# Patient Record
Sex: Male | Born: 1990
Health system: Southern US, Community
[De-identification: ages and names within clinical notes are randomized; demographics above are authoritative.]

---
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
## 2014-05-31 ENCOUNTER — Emergency Department (HOSPITAL_COMMUNITY)
Admission: EM | Admit: 2014-05-31 | Discharge: 2014-05-31 | Disposition: A | Discharge status: Home / Self Care | Payer: BC Managed Care – PPO | Attending: Emergency Medicine | Admitting: Emergency Medicine

## 2014-05-31 ENCOUNTER — Encounter (HOSPITAL_COMMUNITY): Payer: Self-pay

## 2014-05-31 ENCOUNTER — Emergency Department (EMERGENCY_DEPARTMENT_HOSPITAL)
Admission: EM | Admit: 2014-05-31 | Discharge: 2014-06-01 | Disposition: A | Discharge status: Other Acute Inpatient Hospital | Payer: BC Managed Care – PPO | Source: Home / Self Care | Attending: Emergency Medicine | Admitting: Emergency Medicine

## 2014-05-31 ENCOUNTER — Emergency Department (HOSPITAL_COMMUNITY): Payer: BC Managed Care – PPO

## 2014-05-31 ENCOUNTER — Encounter (HOSPITAL_COMMUNITY): Payer: Self-pay | Admitting: *Deleted

## 2014-05-31 DIAGNOSIS — R0602 Shortness of breath: Secondary | ICD-10-CM | POA: Insufficient documentation

## 2014-05-31 DIAGNOSIS — R0981 Nasal congestion: Secondary | ICD-10-CM | POA: Insufficient documentation

## 2014-05-31 DIAGNOSIS — H538 Other visual disturbances: Secondary | ICD-10-CM | POA: Insufficient documentation

## 2014-05-31 DIAGNOSIS — R300 Dysuria: Secondary | ICD-10-CM | POA: Insufficient documentation

## 2014-05-31 DIAGNOSIS — F1110 Opioid abuse, uncomplicated: Secondary | ICD-10-CM | POA: Insufficient documentation

## 2014-05-31 DIAGNOSIS — R509 Fever, unspecified: Secondary | ICD-10-CM | POA: Insufficient documentation

## 2014-05-31 DIAGNOSIS — R197 Diarrhea, unspecified: Secondary | ICD-10-CM

## 2014-05-31 DIAGNOSIS — R6883 Chills (without fever): Secondary | ICD-10-CM | POA: Insufficient documentation

## 2014-05-31 DIAGNOSIS — F1410 Cocaine abuse, uncomplicated: Secondary | ICD-10-CM | POA: Insufficient documentation

## 2014-05-31 DIAGNOSIS — M791 Myalgia: Secondary | ICD-10-CM | POA: Insufficient documentation

## 2014-05-31 DIAGNOSIS — R05 Cough: Secondary | ICD-10-CM | POA: Insufficient documentation

## 2014-05-31 DIAGNOSIS — H539 Unspecified visual disturbance: Secondary | ICD-10-CM

## 2014-05-31 DIAGNOSIS — F1910 Other psychoactive substance abuse, uncomplicated: Secondary | ICD-10-CM

## 2014-05-31 DIAGNOSIS — R110 Nausea: Secondary | ICD-10-CM

## 2014-05-31 DIAGNOSIS — R079 Chest pain, unspecified: Secondary | ICD-10-CM | POA: Insufficient documentation

## 2014-05-31 DIAGNOSIS — F1510 Other stimulant abuse, uncomplicated: Secondary | ICD-10-CM | POA: Insufficient documentation

## 2014-05-31 DIAGNOSIS — R51 Headache: Secondary | ICD-10-CM | POA: Insufficient documentation

## 2014-05-31 DIAGNOSIS — M549 Dorsalgia, unspecified: Secondary | ICD-10-CM | POA: Insufficient documentation

## 2014-05-31 DIAGNOSIS — F1310 Sedative, hypnotic or anxiolytic abuse, uncomplicated: Secondary | ICD-10-CM | POA: Insufficient documentation

## 2014-05-31 DIAGNOSIS — F1210 Cannabis abuse, uncomplicated: Secondary | ICD-10-CM | POA: Insufficient documentation

## 2014-05-31 LAB — CBC WITH DIFFERENTIAL/PLATELET
BASOS ABS: 0 10*3/uL (ref 0.0–0.1)
BASOS PCT: 0 % (ref 0–1)
Eosinophils Absolute: 0.1 10*3/uL (ref 0.0–0.7)
Eosinophils Relative: 1 % (ref 0–5)
HEMATOCRIT: 38.4 % — AB (ref 39.0–52.0)
Hemoglobin: 13.1 g/dL (ref 13.0–17.0)
LYMPHS ABS: 1.8 10*3/uL (ref 0.7–4.0)
LYMPHS PCT: 29 % (ref 12–46)
MCH: 29.8 pg (ref 26.0–34.0)
MCHC: 34.1 g/dL (ref 30.0–36.0)
MCV: 87.5 fL (ref 78.0–100.0)
MONO ABS: 0.6 10*3/uL (ref 0.1–1.0)
MONOS PCT: 9 % (ref 3–12)
Neutro Abs: 3.8 10*3/uL (ref 1.7–7.7)
Neutrophils Relative %: 61 % (ref 43–77)
Platelets: 195 10*3/uL (ref 150–400)
RBC: 4.39 MIL/uL (ref 4.22–5.81)
RDW: 13.4 % (ref 11.5–15.5)
WBC: 6.2 10*3/uL (ref 4.0–10.5)

## 2014-05-31 LAB — RAPID URINE DRUG SCREEN, HOSP PERFORMED
Amphetamines: POSITIVE — AB
BARBITURATES: NOT DETECTED
Benzodiazepines: POSITIVE — AB
Cocaine: POSITIVE — AB
Opiates: NOT DETECTED
TETRAHYDROCANNABINOL: POSITIVE — AB

## 2014-05-31 LAB — COMPREHENSIVE METABOLIC PANEL
ALBUMIN: 4.3 g/dL (ref 3.5–5.2)
ALK PHOS: 110 U/L (ref 39–117)
ALT: 195 U/L — ABNORMAL HIGH (ref 0–53)
ANION GAP: 12 (ref 5–15)
AST: 78 U/L — AB (ref 0–37)
BILIRUBIN TOTAL: 0.3 mg/dL (ref 0.3–1.2)
BUN: 10 mg/dL (ref 6–23)
CALCIUM: 9.6 mg/dL (ref 8.4–10.5)
CHLORIDE: 104 meq/L (ref 96–112)
CO2: 26 meq/L (ref 19–32)
CREATININE: 1.03 mg/dL (ref 0.50–1.35)
GFR calc Af Amer: >90 mL/min (ref >90)
GFR calc non Af Amer: >90 mL/min (ref >90)
Glucose, Bld: 93 mg/dL (ref 70–99)
POTASSIUM: 3.6 meq/L — AB (ref 3.7–5.3)
SODIUM: 142 meq/L (ref 137–147)
TOTAL PROTEIN: 7.5 g/dL (ref 6.0–8.3)

## 2014-05-31 LAB — ETHANOL: Alcohol, Ethyl (B): <11 mg/dL (ref 0–11)

## 2014-05-31 MED ORDER — LORAZEPAM 2 MG/ML IJ SOLN
1.0000 mg | Freq: Once | INTRAMUSCULAR | Status: AC
  Administered 2014-05-31: 1 mg via INTRAVENOUS
  Filled 2014-05-31: qty 1

## 2014-05-31 MED ORDER — SODIUM CHLORIDE 0.9 % IV SOLN: INTRAVENOUS | Status: DC

## 2014-05-31 MED ORDER — SODIUM CHLORIDE 0.9 % IV BOLUS (SEPSIS)
1000.0000 mL | Freq: Once | INTRAVENOUS | Status: AC
  Administered 2014-05-31: 1000 mL via INTRAVENOUS

## 2014-05-31 MED ORDER — CLONIDINE HCL 0.2 MG PO TABS: 0.2000 mg | ORAL_TABLET | Freq: Once | ORAL | Status: DC

## 2014-05-31 MED ORDER — ONDANSETRON HCL 4 MG/2ML IJ SOLN
4.0000 mg | Freq: Once | INTRAMUSCULAR | Status: AC
  Administered 2014-05-31: 4 mg via INTRAVENOUS
  Filled 2014-05-31: qty 2

## 2014-05-31 MED ORDER — CLONIDINE HCL 0.2 MG PO TABS
0.2000 mg | ORAL_TABLET | Freq: Once | ORAL | Status: AC
  Administered 2014-05-31: 0.2 mg via ORAL
  Filled 2014-05-31: qty 1

## 2014-05-31 NOTE — Discharge Instructions (Signed)
Chemical Dependency Chemical dependency is an addiction to drugs or alcohol. It is characterized by the repeated behavior of seeking out and using drugs and alcohol despite harmful consequences to the health and safety of ones self and others.  RISK FACTORS There are certain situations or behaviors that increase a person's risk for chemical dependency. These include:  A family history of chemical dependency.  A history of mental health issues, including depression and anxiety.  A home environment where drugs and alcohol are easily available to you.  Drug or alcohol use at a young age. SYMPTOMS  The following symptoms can indicate chemical dependency:  Inability to limit the use of drugs or alcohol.  Nausea, sweating, shakiness, and anxiety that occurs when alcohol or drugs are not being used.  An increase in amount of drugs or alcohol that is necessary to get drunk or high. People who experience these symptoms can assess their use of drugs and alcohol by asking themselves the following questions:  Have you been told by friends or family that they are worried about your use of alcohol or drugs?  Do friends and family ever tell you about things you did while drinking alcohol or using drugs that you do not remember?  Do you lie about using alcohol or drugs or about the amounts you use?  Do you have difficulty completing daily tasks unless you use alcohol or drugs?  Is the level of your work or school performance lower because of your drug or alcohol use?  Do you get sick from using drugs or alcohol but keep using anyway?  Do you feel uncomfortable in social situations unless you use alcohol or drugs?  Do you use drugs or alcohol to help forget problems? An answer of yes to any of these questions may indicate chemical dependency. Professional evaluation is suggested. Document Released: 07/10/2001 Document Revised: 10/08/2011 Document Reviewed: 09/21/2010 [name] Patient  Information 2015 [place], [place]. This information is not intended to replace advice given to you by your health care provider. Make sure you discuss any questions you have with your health care provider.   Emergency Department Resource Guide 1) Find a Doctor and Pay Out of Pocket Although you won't have to find out who is covered by your insurance plan, it is a good idea to ask around and get recommendations. You will then need to call the office and see if the doctor you have chosen will accept you as a new patient and what types of options they offer for patients who are self-pay. Some doctors offer discounts or will set up payment plans for their patients who do not have insurance, but you will need to ask so you aren't surprised when you get to your appointment.  2) Contact Your Local Health Department Not all health departments have doctors that can see patients for sick visits, but many do, so it is worth a call to see if yours does. If you don't know where your local health department is, you can check in your phone book. The CDC also has a tool to help you locate your state's health department, and many state websites also have listings of all of their local health departments.  3) Find a Walk-in Clinic If your illness is not likely to be very severe or complicated, you may want to try a walk in clinic. These are popping up all over the country in pharmacies, drugstores, and shopping centers. They're usually staffed by nurse practitioners or physician assistants that have  been trained to treat common illnesses and complaints. They're usually fairly quick and inexpensive. However, if you have serious medical issues or chronic medical problems, these are probably not your best option.  No Primary Care Doctor: - Call Health Connect at  [phone] - they can help you locate a primary care doctor that  accepts your insurance, provides certain services, etc. - Physician Referral Service-  [phone]  Chronic Pain Problems: Organization         Address  Phone   Notes  [facility]  [phone] Patients need to be referred by their primary care doctor.   Medication Assistance: Organization         Address  Phone   Notes  [facility] Medication [facility] [address] [place] [place], [place] [address] [phone] --Must be a resident of [facility] -- Must have NO insurance coverage whatsoever (no Medicaid/ Medicare, etc.) -- The pt. MUST have a primary care doctor that directs their care regularly and follows them in the community   [facility]ist  [phone]   [employer]  [phone]    Agencies that provide inexpensive medical care: Organization         Address  Phone   Notes  [facility]  [phone]   [facility]    [phone]   [facility] [address] [place], [place] [address] [phone]   [facility] [address] [place], [place] [phone]   Planned Parenthood    [phone]   [facility]    [phone]   Community Health and [facility]  [address], [place] Phone:  [phone], Fax:  [phone] Hours of Operation:  9 am - 6 pm, M-F.  Also accepts Medicaid/Medicare and self-pay.  [facility]  [address], [place] Phone: [phone], Fax: [phone]. Hours of Operation:  8:30 am - 5:30 pm, M-F.  Also accepts Medicaid and self-pay.  [facility] [place] [address] [place] Phone: [phone]   [facility] [address] Natasha Bence [place], [place] [phone], Ext. [phone] Mondays & Thursdays: 7-9 AM.  First 15 patients are seen on a first come, first serve basis.    Medicaid-accepting [facility] Providers:  Organization         Address  Phone   Notes  [facility] [address],  [place] [phone] Also accepts self-pay patients.  [facility] [address] Laurell Josephs [place], [place]  [phone]   [facility] [address], [place] [phone]   [facility] [address] [place] [address] [place] [phone]   Renaye Rakers [address], [place]   [phone] Only accepts [place] patients after they have their name applied to their card.   Self-Pay (no insurance) in [facility]:  Organization         Address  Phone   Notes  Sickle Cell Patients, [facility] Internal Medicine [address] [place], [place] [phone]   [facility] Care [address] [place], [place] [phone]   [facility] [place]  [address], [place] [phone]   [facility]/Dr. Osei-Bonsu  [address] [place] [address], [place] or  [phone], [address], [place] [phone] Phone number for both [facility] and [place] locations is the same.  Urgent Medical and [facility] [address], [place] [phone]) [phone]   [facility] [place] [address] [place] [address], [place] or [address] [phone]) [phone]   [facility] [address], [place] [phone]) [phone], phone; [phone], fax Sees patients 1st and 3rd Saturday of every month.  Must not qualify for public or private insurance (i.e. Medicaid, Medicare, [place] Health Choice, Veterans' Benefits)  Household income should be no more than 200% of the poverty level The clinic cannot treat you if you are pregnant or think you are pregnant  Sexually transmitted diseases are not treated at the clinic.    Dental Care: Organization         Address  Phone  Notes  [facility] [address], [place] [phone] Accepts children up to age 23 who are enrolled in  [place]icaid or [place] Health Choice; pregnant women with a Medicaid card; and children who have applied for Medicaid or [place] Health Choice, but were declined, whose parents can pay a reduced fee at time of service.  [facility] [place]  [address], [place] [phone] Accepts children up to age 23 who are enrolled in [place] or [place] Health Choice; pregnant women with a Medicaid card; and children who have applied for Medicaid or [place] Health Choice, but were declined, whose parents can pay a reduced fee at time of service.  [facility]  [address], [place] [phone]) [phone] Patients are seen by appointment only. Walk-ins are not accepted. [facility] will see patients 23 years of age and older. Monday - Tuesday (8am-5pm) Most Wednesdays (8:30-5pm) $30 per visit, cash only  [facility]  [address], [facility] [phone]) [phone] Patients are seen by appointment only. Walk-ins are not accepted. [facility] will see patients 23 years of age and older. One Wednesday Evening (Monthly: Volunteer Based).  $30 per visit, cash only  [employer] [facility]  [phone] for adults; Children under age 664, call Graduate Pediatric Dentistry at [phone]. Children aged 614-14, please call [phone] to request a pediatric application.  Dental services are provided in all areas of dental care including fillings, crowns and bridges, complete and partial dentures, implants, gum treatment, root canals, and extractions. Preventive care is also provided. Treatment is provided to both adults and children. Patients are selected via a lottery and there is often a waiting list.   [facility] [address], [place]  [phone] [email or web address]   [facility] [address], [place], [place] [phone], Ext. [phone] Second and Fourth Thursday of each month, opens at 6:30  AM; Clinic ends at 9 AM.  Patients are seen on a first-come first-served basis, and a limited number are seen during each clinic.   [facility]  [address], [place], [place] [phone]   Eligibility Requirements You must have lived in [place], [place], or [place] counties for at least the last three months.   You cannot be eligible for state or federal sponsored [place] Cityhealthcare insurance, including CIGNAVeterans Administration, [place]icaid, or [employer]icare.   You generally cannot be eligible for healthcare insurance through your employer.    How to apply: Eligibility screenings  are held every Tuesday and Wednesday afternoon from 1:00 pm until 4:00 pm. You do not need an appointment for the interview!  [facility] [address] [place] [address], [place], [place] [phone]   [facility]  [phone]   [facility]  [phone]   [facility]  [phone]    Behavioral Health Resources in the Community: Intensive Outpatient Programs Organization         Address  Phone  Notes  [facility] [address], [place], [place] [phone]   [facility] [address], [place], [place] [phone]   [facility] [address], [place], [place]  [phone]   [facility] [address],  [place], [place] [phone] or [phone]   Substance Abuse Resources Organization         Address  Phone  Notes  [facility]  [phone]   [facility]  [phone]   [facility]  [phone]   [facility]  [phone]   Residential & Outpatient Substance Abuse Program  [phone]   Psychological Services Organization         Address  Phone  Notes  [facility]  [phone]- [phone]   [facility]  [phone]- [phone]   [facility] [address], [place] [phone] or  [phone]    Mobile Crisis Teams Organization         Address  Phone  Notes  [facility], Mobile Crisis Care Unit  [phone]   [facility]  [address] [place], [place] [phone]   Doristine LocksSharon DeEsch [address] [place] [phone]    Self-Help/Support Groups Organization         Address  Phone             Notes  [facility] [place] - variety of support groups  [phone]- [phone] Call for more information  Narcotics Anonymous (NA), [facility] [address], [place]  2 meetings at this location   Statisticianesidential Treatment Programs Organization         Address  Phone  Notes  [facility] [address],    [place]  [phone]   [facility]  [address], [place] [id number], [place], [place] [phone]   [facility] [address], [place] [phone] Admissions: 8am-3pm M-F  [facility] [address]    [place], [place] [phone]   [facility] [address], [place], [place] [phone]   [facility] [address]  [place], [place] [phone]   [facility] - Intensive Outpatient [address], [place], [place] [phone]   [facility] ([facility] [address]  [place], [place] [phone] or [phone]   [facility] ([facility]) [address] [place], [place] [phone] Accepts Medicaid  [facility] [address]  [place] [phone] Substance Abuse/Addiction Treatment   [facility] Behavioral Health Resources Organization         Address  Phone  Notes  [facility]  [phone]) [phone]   Angie FavaJulie Brannon, PhD [address] [place], [place]   [phone] or [phone](336) [phone]   [facility]   [address] [place], [place] [phone]) [phone]   [facility] [address],  [place], [place] [phone](336) [phone]  Insurance/Medicaid/sponsorship through [employer] and Families [address]                                    [place], [place] [phone] Therapy/tele-psych/case  [facility] [address]   [place], [place] [phone]    Dr. Lolly Mustache  [phone]   [facility]  [employer] [facility] Dept. 1) [address], [place] 2) [address], [place] 3)  [address], [place] [phone]  [phone]   [facility] Child Abuse Hotline 416-755-4185 or [phone] (After Hours)      Information provided above to help you with outpatient detox if you choose to go that route. Return for any new or worse symptoms. As you know we were planning to arrange inpatient detox for you but we understand that you no longer  want inpatient detox.Marland Kitchen

## 2014-05-31 NOTE — ED Notes (Signed)
Pt in room back from xray.

## 2014-05-31 NOTE — ED Notes (Signed)
Pt states he has been up all night shooting up. States he likes the adrenalin rush he gets from it. States he has been shooting up heroin and oxymorphine

## 2014-05-31 NOTE — ED Notes (Signed)
Pt parents in waiting room requesting to speak to pt doctor. Pt did not give permission for medical information to be discussed with parents.

## 2014-05-31 NOTE — ED Notes (Addendum)
Pt arrived to ER in police custody w/ IVC papers that pt parents had taken out saying pt has been using drugs & possibly attemeted suicide. Pt denies any SI. Pt calm & cooperative. Pt explained that he must stay & [facility]. No new labs needed per EDP since they were just done. [place] is [place]/ [employer] [place] at this time. Pt understands what has to be done.

## 2014-05-31 NOTE — ED Provider Notes (Addendum)
CSN: [id number]     Arrival date & time 05/31/14  2113 History   First MD Initiated Contact with Patient 05/31/14 2136     No chief complaint on file.    (Consider location/radiation/quality/duration/timing/severity/associated sxs/prior Treatment) The history is provided by the patient and the police.  23 year old male brought back with IVC papers. Taken out by parents. For suicidal and self intent of harm with his drug abuse problem. Patient was just seen by me earlier. Was brought in by the sheriff's department there were no IVC papers at that time. Patient denied suicidal ideation. Patient wanted inpatient detox for his herion when and opiate substance abuse problems. Patient eventually decided he did not want inpatient treatment and was discharged home. Patient denied again being suicidal. Patient had no grounds to be held against his will for inpatient detox. We had moved forward to having contacted a consult to TSS but they had not spoken with him yet.  Patient had lab work earlier and was clinically cleared for they've a health assessment. Repeating lab work should not be necessary. Patient's family took out the magistrate papers on him and he's been brought back by the sheriff.  History reviewed. No pertinent past medical history. History reviewed. No pertinent past surgical history. History reviewed. No pertinent family history. History  Substance Use Topics  . Smoking status: Never Smoker   . Smokeless tobacco: Not on file  . Alcohol Use: Yes    Review of Systems  Constitutional: Positive for chills. Negative for fever.  HENT: Positive for congestion.   Eyes: Positive for visual disturbance.  Respiratory: Positive for shortness of breath.   Cardiovascular: Negative for chest pain.  Gastrointestinal: Positive for nausea and diarrhea. Negative for abdominal pain.  Genitourinary: Negative for dysuria.  Musculoskeletal: Positive for myalgias.  Skin: Negative for rash.   Neurological: Positive for headaches.  Hematological: Does not bruise/bleed easily.  Psychiatric/Behavioral: Negative for suicidal ideas.      Allergies  Review of patient's allergies indicates no known allergies.  Home Medications   Prior to Admission medications   Not on File   BP 97/63 mmHg  Pulse 62  Temp(Src) 97.7 F (36.5 C) (Oral)  Resp 24  Ht 5\' 10"  (1.778 m)  Wt 150 lb (68.04 kg)  BMI 21.52 kg/m2  SpO2 100% Physical Exam  Constitutional: He is oriented to person, place, and time. He appears well-developed and well-nourished. No distress.  HENT:  Head: Normocephalic and atraumatic.  Eyes: Conjunctivae and EOM are normal. Pupils are equal, round, and reactive to light.  Pupils dilated. Not pinpoint.  Neck: Normal range of motion. Neck supple.  Cardiovascular: Normal rate, regular rhythm and normal heart sounds.   No murmur heard. Pulmonary/Chest: Effort normal and breath sounds normal. No respiratory distress.  Abdominal: Soft. Bowel sounds are normal. There is no tenderness.  Musculoskeletal: Normal range of motion. He exhibits no edema.  Neurological: He is alert and oriented to person, place, and time. No cranial nerve deficit. He exhibits normal muscle tone. Coordination normal.  But somnolent.  Skin: Skin is warm. No rash noted.  Nursing note and vitals reviewed.   ED Course  Procedures (including critical care time) Labs Review Labs Reviewed - No data to display  Imaging Review Dg Chest 2 View  05/31/2014   CLINICAL DATA:  Acute Chest pain.  IV drug use.  EXAM: CHEST  2 VIEW  COMPARISON:  10/29/2009  FINDINGS: The cardiac silhouette, mediastinal and hilar contours are within normal limits  and stable. The lungs are clear. No pleural effusion or pneumothorax. Moderate thoracolumbar scoliosis is again demonstrated.  IMPRESSION: No acute cardiopulmonary findings.   Electronically Signed   By: Loralie Champagne M.D.   On: 05/31/2014 15:53   Results for  orders placed or performed during the hospital encounter of 05/31/14  CBC with Differential  Result Value Ref Range   WBC 6.2 4.0 - 10.5 K/uL   RBC 4.39 4.22 - 5.81 MIL/uL   Hemoglobin 13.1 13.0 - 17.0 g/dL   HCT 21.3 (L) 08.6 - 57.8 %   MCV 87.5 78.0 - 100.0 fL   MCH 29.8 26.0 - 34.0 pg   MCHC 34.1 30.0 - 36.0 g/dL   RDW 46.9 62.9 - 52.8 %   Platelets 195 150 - 400 K/uL   Neutrophils Relative % 61 43 - 77 %   Neutro Abs 3.8 1.7 - 7.7 K/uL   Lymphocytes Relative 29 12 - 46 %   Lymphs Abs 1.8 0.7 - 4.0 K/uL   Monocytes Relative 9 3 - 12 %   Monocytes Absolute 0.6 0.1 - 1.0 K/uL   Eosinophils Relative 1 0 - 5 %   Eosinophils Absolute 0.1 0.0 - 0.7 K/uL   Basophils Relative 0 0 - 1 %   Basophils Absolute 0.0 0.0 - 0.1 K/uL  Comprehensive metabolic panel  Result Value Ref Range   Sodium 142 137 - 147 mEq/L   Potassium 3.6 (L) 3.7 - 5.3 mEq/L   Chloride 104 96 - 112 mEq/L   CO2 26 19 - 32 mEq/L   Glucose, Bld 93 70 - 99 mg/dL   BUN 10 6 - 23 mg/dL   Creatinine, Ser 4.13 0.50 - 1.35 mg/dL   Calcium 9.6 8.4 - 24.4 mg/dL   Total Protein 7.5 6.0 - 8.3 g/dL   Albumin 4.3 3.5 - 5.2 g/dL   AST 78 (H) 0 - 37 U/L   ALT 195 (H) 0 - 53 U/L   Alkaline Phosphatase 110 39 - 117 U/L   Total Bilirubin 0.3 0.3 - 1.2 mg/dL   GFR calc non Af Amer >90 >90 mL/min   GFR calc Af Amer >90 >90 mL/min   Anion gap 12 5 - 15  Ethanol  Result Value Ref Range   Alcohol, Ethyl (B) <11 0 - 11 mg/dL  Urine rapid drug screen (hosp performed)  Result Value Ref Range   Opiates NONE DETECTED NONE DETECTED   Cocaine POSITIVE (A) NONE DETECTED   Benzodiazepines POSITIVE (A) NONE DETECTED   Amphetamines POSITIVE (A) NONE DETECTED   Tetrahydrocannabinol POSITIVE (A) NONE DETECTED   Barbiturates NONE DETECTED NONE DETECTED      EKG Interpretation None      MDM   Final diagnoses:  Substance abuse    Patient seen by me earlier. Brought in by [facility] department. Patient was not on IVC. Patient  stated that he wanted voluntary detox for his substance abuse problems. He denied any suicidal ideations multiple times. Patient eventually decided he did not want inpatient detox and wanted to go home. There was no qualification to hold him. Patient was discharged home.  Family now arrives with papers taken out by the magistrate for an IVC hold. Claiming that he was suicidal. 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 But didn't male he denies any suicidal thoughts. Now that patient is on IVC will have the [facility] evaluate him.    Vanetta Mulders, MD  05/31/14 2145  Addendum: Although patient was fully discharge. He never left the waiting room of the emergency department. He was waiting for his parents to come pick him up. That's when the sheriffs were handed the IVC paperwork from the magistrate. Patient states he still not suicidal. Patient also stated he did not do any drugs while he was in the waiting room.  Vanetta MuldersScott Charlesa Ehle, MD 05/31/14 2149

## 2014-05-31 NOTE — ED Notes (Addendum)
Pt brought to ER by deputy with IVC papers.  Seen here earlier today.Parents signed commitment papers  Wanded at triage.

## 2014-05-31 NOTE — ED Notes (Signed)
Pt calm and cooperative. [employer] officer signed off on per EDP permission. Pt agrees to receive voluntary detox treatment per EDP.

## 2014-05-31 NOTE — ED Provider Notes (Addendum)
CSN: [id number]     Arrival date & time 05/31/14  1419 History  This chart was scribed for Dustin Case Dustin Fiebig, MD by Tonye RoyaltyJoshua Chen, ED Scribe. This patient was seen in room [facility] and the patient's care was started at 2:43 PM.    Chief Complaint  Patient presents with  . Drug Overdose   Patient is a 23 y.o. male presenting with Overdose. The history is provided by the patient. No language interpreter was used.  Drug Overdose This is a chronic problem. The current episode started 6 to 12 hours ago. The problem occurs daily. The problem has not changed since onset.Associated symptoms include chest pain, abdominal pain, headaches and shortness of breath. The symptoms are aggravated by stress. Nothing relieves the symptoms. He has tried nothing for the symptoms.    HPI Comments: Cleora [name] is a 23 y.o. male who presents to the Emergency Department complaining of drug overdose. Law enforcement brought him here under IVC by his mother, who stated he said he took a lethal dose of heroin. Per Patent examinerlaw enforcement, he stated he had taken in excess of 10 doses of heroin last night in addition to some other opioids because of problems he was having with his parents who threatened to call his boss and make him lose his job. Law enforcement states the patient made several comments that led him to believe that he has suicidal ideation. He reports concern about withdrawal; he states he last used oxymorphine mixed with hydrazine and heroin 4 hours ago. He denies suicidal intent, though he states he is aware that he was taking large amounts of heroin and oxymorphine. However, he states he uses these drugs regularly, approximately 10 times a day. He states he wants to stop using them. He states he is in withdrawal and reports nausea but denies vomiting.  History reviewed. No pertinent past medical history. History reviewed. No pertinent past surgical history. No family history on file. History  Substance Use Topics   . Smoking status: Never Smoker   . Smokeless tobacco: Not on file  . Alcohol Use: Yes    Review of Systems  Constitutional: Positive for fever ("head feels like it's hot") and chills.  HENT: Negative for rhinorrhea (has rhinorrhea when he has not taken his drugs) and sore throat.   Eyes: Positive for visual disturbance (blurry vision).  Respiratory: Positive for cough and shortness of breath.   Cardiovascular: Positive for chest pain. Negative for leg swelling.  Gastrointestinal: Positive for nausea, abdominal pain and diarrhea (chronic). Negative for vomiting.  Genitourinary: Positive for dysuria and difficulty urinating.  Musculoskeletal: Positive for back pain (has scoliosis).  Skin: Negative for rash.  Neurological: Positive for headaches.  Hematological: Does not bruise/bleed easily.  Psychiatric/Behavioral: Positive for self-injury. Negative for suicidal ideas and confusion.      Allergies  Review of patient's allergies indicates no known allergies.  Home Medications   Prior to Admission medications   Not on File   BP 112/78 mmHg  Pulse 65  Temp(Src) 98.2 F (36.8 C) (Oral)  Resp 16  Ht 5\' 10"  (1.778 m)  Wt 145 lb (65.772 kg)  BMI 20.81 kg/m2  SpO2 96% Physical Exam  Constitutional: He is oriented to person, place, and time. He appears well-developed and well-nourished.  HENT:  Head: Normocephalic and atraumatic.  Mouth/Throat: No oropharyngeal exudate.  Mucous membranes moist. Some erythema to back of throat  Eyes: Conjunctivae and EOM are normal.  Pupils dilated 6mm  Neck: Normal range of  motion. Neck supple.  Cardiovascular: Normal rate and regular rhythm.   Pulmonary/Chest: Effort normal. No respiratory distress. He has no wheezes. He has no rales.  Lungs clear on both sides  Abdominal: Soft. Bowel sounds are normal. He exhibits no distension. There is no tenderness. There is no rebound and no guarding.  Musculoskeletal: Normal range of motion. He  exhibits no edema.  Neurological: He is alert and oriented to person, place, and time. No cranial nerve deficit. He exhibits normal muscle tone. Coordination normal.  Skin: Skin is warm and dry.  Psychiatric: He has a normal mood and affect.  Nursing note and vitals reviewed.   ED Course  Procedures (including critical care time)  DIAGNOSTIC STUDIES: Oxygen Saturation is 100% on room air, normal by my interpretation.    COORDINATION OF CARE: 2:54 PM Discussed treatment plan with patient at beside, the patient agrees with the plan and has no further questions at this time.   Labs Review Labs Reviewed  CBC WITH DIFFERENTIAL - Abnormal; Notable for the following:    HCT 38.4 (*)    All other components within normal limits  COMPREHENSIVE METABOLIC PANEL - Abnormal; Notable for the following:    Potassium 3.6 (*)    AST 78 (*)    ALT 195 (*)    All other components within normal limits  ETHANOL  URINE RAPID DRUG SCREEN (HOSP PERFORMED)   Results for orders placed or performed during the hospital encounter of 05/31/14  CBC with Differential  Result Value Ref Range   WBC 6.2 4.0 - 10.5 K/uL   RBC 4.39 4.22 - 5.81 MIL/uL   Hemoglobin 13.1 13.0 - 17.0 g/dL   HCT 16.1 (L) 09.6 - 04.5 %   MCV 87.5 78.0 - 100.0 fL   MCH 29.8 26.0 - 34.0 pg   MCHC 34.1 30.0 - 36.0 g/dL   RDW 40.9 81.1 - 91.4 %   Platelets 195 150 - 400 K/uL   Neutrophils Relative % 61 43 - 77 %   Neutro Abs 3.8 1.7 - 7.7 K/uL   Lymphocytes Relative 29 12 - 46 %   Lymphs Abs 1.8 0.7 - 4.0 K/uL   Monocytes Relative 9 3 - 12 %   Monocytes Absolute 0.6 0.1 - 1.0 K/uL   Eosinophils Relative 1 0 - 5 %   Eosinophils Absolute 0.1 0.0 - 0.7 K/uL   Basophils Relative 0 0 - 1 %   Basophils Absolute 0.0 0.0 - 0.1 K/uL  Comprehensive metabolic panel  Result Value Ref Range   Sodium 142 137 - 147 mEq/L   Potassium 3.6 (L) 3.7 - 5.3 mEq/L   Chloride 104 96 - 112 mEq/L   CO2 26 19 - 32 mEq/L   Glucose, Bld 93 70 - 99  mg/dL   BUN 10 6 - 23 mg/dL   Creatinine, Ser 7.82 0.50 - 1.35 mg/dL   Calcium 9.6 8.4 - 95.6 mg/dL   Total Protein 7.5 6.0 - 8.3 g/dL   Albumin 4.3 3.5 - 5.2 g/dL   AST 78 (H) 0 - 37 U/L   ALT 195 (H) 0 - 53 U/L   Alkaline Phosphatase 110 39 - 117 U/L   Total Bilirubin 0.3 0.3 - 1.2 mg/dL   GFR calc non Af Amer >90 >90 mL/min   GFR calc Af Amer >90 >90 mL/min   Anion gap 12 5 - 15  Ethanol  Result Value Ref Range   Alcohol, Ethyl (B) <11 0 - 11  mg/dL     Imaging Review Dg Chest 2 View  05/31/2014   CLINICAL DATA:  Acute Chest pain.  IV drug use.  EXAM: CHEST  2 VIEW  COMPARISON:  10/29/2009  FINDINGS: The cardiac silhouette, mediastinal and hilar contours are within normal limits and stable. The lungs are clear. No pleural effusion or pneumothorax. Moderate thoracolumbar scoliosis is again demonstrated.  IMPRESSION: No acute cardiopulmonary findings.   Electronically Signed   By: Loralie ChampagneMark  Gallerani M.D.   On: 05/31/2014 15:53     EKG Interpretation   Date/Time:  Monday May 31 2014 14:33:21 EST Ventricular Rate:  74 PR Interval:  150 QRS Duration: 113 QT Interval:  403 QTC Calculation: 447 R Axis:   31 Text Interpretation:  Sinus rhythm Incomplete right bundle branch block  Baseline wander in lead(s) I III aVR aVL No previous ECGs available  Confirmed by Kelin Nixon  MD, Camylle Whicker [phone]([id number]) on 05/31/2014 2:37:06 PM      MDM   Final diagnoses:  SOB (shortness of breath)  Substance abuse   No suicidal ideation or intent. [facility] Not [facility]. No paperwork. 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 Also sometimes mixes and other medications. Patient is stating that he wants help. Patient is currently having to inject himself 10 times a day and in between goes through withdrawal. Patient would like to have help with withdrawal symptoms and also to go through detox. mild hypokalemia not clinically significant. Patient  also did complain of shortness of breath oxygen saturation is are fine on room air. Chest x-ray was negative for pneumonia pneumothora or pulmonary edema.  I personally performed the services described in this documentation, which was scribed in my presence. The recorded information has been reviewed and is accurate.     Dustin Case Sims Laday, MD 05/31/14 1627   Addendum: Patient does change his mind about inpatient detox. He once to go home. Patient is not on a hold. Patient has no suicidal ideation. Patient will be discharged home. Outpatient information provided.  Dustin Case Jahaan Vanwagner, MD 05/31/14 [phone]

## 2014-06-01 ENCOUNTER — Encounter (HOSPITAL_COMMUNITY): Payer: Self-pay | Admitting: *Deleted

## 2014-06-01 ENCOUNTER — Inpatient Hospital Stay (HOSPITAL_COMMUNITY)
Admission: AD | Admit: 2014-06-01 | Discharge: 2014-06-04 | DRG: 897 | Disposition: A | Discharge status: Home / Self Care | Payer: BC Managed Care – PPO | Source: Intra-hospital | Attending: Psychiatry | Admitting: Psychiatry

## 2014-06-01 DIAGNOSIS — F1120 Opioid dependence, uncomplicated: Secondary | ICD-10-CM | POA: Diagnosis present

## 2014-06-01 DIAGNOSIS — F1123 Opioid dependence with withdrawal: Secondary | ICD-10-CM | POA: Diagnosis present

## 2014-06-01 DIAGNOSIS — B1920 Unspecified viral hepatitis C without hepatic coma: Secondary | ICD-10-CM | POA: Diagnosis present

## 2014-06-01 DIAGNOSIS — F1994 Other psychoactive substance use, unspecified with psychoactive substance-induced mood disorder: Secondary | ICD-10-CM | POA: Diagnosis not present

## 2014-06-01 DIAGNOSIS — F329 Major depressive disorder, single episode, unspecified: Secondary | ICD-10-CM

## 2014-06-01 DIAGNOSIS — F1410 Cocaine abuse, uncomplicated: Secondary | ICD-10-CM | POA: Diagnosis present

## 2014-06-01 DIAGNOSIS — F1910 Other psychoactive substance abuse, uncomplicated: Secondary | ICD-10-CM | POA: Insufficient documentation

## 2014-06-01 DIAGNOSIS — F1110 Opioid abuse, uncomplicated: Secondary | ICD-10-CM | POA: Diagnosis present

## 2014-06-01 DIAGNOSIS — Z599 Problem related to housing and economic circumstances, unspecified: Secondary | ICD-10-CM

## 2014-06-01 DIAGNOSIS — F411 Generalized anxiety disorder: Secondary | ICD-10-CM | POA: Diagnosis present

## 2014-06-01 DIAGNOSIS — F1920 Other psychoactive substance dependence, uncomplicated: Secondary | ICD-10-CM

## 2014-06-01 DIAGNOSIS — F1290 Cannabis use, unspecified, uncomplicated: Secondary | ICD-10-CM | POA: Diagnosis present

## 2014-06-01 DIAGNOSIS — F909 Attention-deficit hyperactivity disorder, unspecified type: Secondary | ICD-10-CM | POA: Diagnosis present

## 2014-06-01 MED ORDER — ALUM & MAG HYDROXIDE-SIMETH 200-200-20 MG/5ML PO SUSP: 30.0000 mL | ORAL | Status: DC | PRN

## 2014-06-01 MED ORDER — LORAZEPAM 1 MG PO TABS: 1.0000 mg | ORAL_TABLET | Freq: Four times a day (QID) | ORAL | Status: DC | PRN

## 2014-06-01 MED ORDER — LOPERAMIDE HCL 2 MG PO CAPS: 2.0000 mg | ORAL_CAPSULE | ORAL | Status: DC | PRN

## 2014-06-01 MED ORDER — TRAZODONE HCL 50 MG PO TABS
50.0000 mg | ORAL_TABLET | Freq: Every evening | ORAL | Status: DC | PRN
  Administered 2014-06-01 – 2014-06-02 (×3): 50 mg via ORAL
  Filled 2014-06-01 (×7): qty 1

## 2014-06-01 MED ORDER — LORAZEPAM 1 MG PO TABS
1.0000 mg | ORAL_TABLET | Freq: Three times a day (TID) | ORAL | Status: DC
  Administered 2014-06-03: 1 mg via ORAL
  Filled 2014-06-01 (×2): qty 1

## 2014-06-01 MED ORDER — LORAZEPAM 1 MG PO TABS: 1.0000 mg | ORAL_TABLET | Freq: Every day | ORAL | Status: DC

## 2014-06-01 MED ORDER — ACETAMINOPHEN 325 MG PO TABS
650.0000 mg | ORAL_TABLET | Freq: Four times a day (QID) | ORAL | Status: DC | PRN
  Administered 2014-06-02: 650 mg via ORAL
  Filled 2014-06-01: qty 2

## 2014-06-01 MED ORDER — POTASSIUM CHLORIDE CRYS ER 20 MEQ PO TBCR
20.0000 meq | EXTENDED_RELEASE_TABLET | Freq: Two times a day (BID) | ORAL | Status: AC
  Administered 2014-06-01 – 2014-06-03 (×4): 20 meq via ORAL
  Filled 2014-06-01 (×6): qty 1

## 2014-06-01 MED ORDER — THIAMINE HCL 100 MG/ML IJ SOLN: 100.0000 mg | Freq: Once | INTRAMUSCULAR | Status: DC

## 2014-06-01 MED ORDER — VITAMIN B-1 100 MG PO TABS
100.0000 mg | ORAL_TABLET | Freq: Every day | ORAL | Status: DC
Start: 2014-06-02 — End: 2014-06-04
  Administered 2014-06-02 – 2014-06-04 (×3): 100 mg via ORAL
  Filled 2014-06-01 (×6): qty 1

## 2014-06-01 MED ORDER — HYDROXYZINE HCL 25 MG PO TABS
25.0000 mg | ORAL_TABLET | Freq: Four times a day (QID) | ORAL | Status: DC | PRN
  Administered 2014-06-02: 25 mg via ORAL
  Filled 2014-06-01: qty 1

## 2014-06-01 MED ORDER — LORAZEPAM 1 MG PO TABS: 1.0000 mg | ORAL_TABLET | Freq: Two times a day (BID) | ORAL | Status: DC

## 2014-06-01 MED ORDER — ADULT MULTIVITAMIN W/MINERALS CH
1.0000 | ORAL_TABLET | Freq: Every day | ORAL | Status: DC
Start: 2014-06-02 — End: 2014-06-04
  Administered 2014-06-02 – 2014-06-04 (×3): 1 via ORAL
  Filled 2014-06-01 (×6): qty 1

## 2014-06-01 MED ORDER — ONDANSETRON 4 MG PO TBDP: 4.0000 mg | ORAL_TABLET | Freq: Four times a day (QID) | ORAL | Status: DC | PRN

## 2014-06-01 MED ORDER — LORAZEPAM 1 MG PO TABS
1.0000 mg | ORAL_TABLET | Freq: Four times a day (QID) | ORAL | Status: AC
  Administered 2014-06-01 – 2014-06-02 (×5): 1 mg via ORAL
  Filled 2014-06-01 (×5): qty 1

## 2014-06-01 MED ORDER — MAGNESIUM HYDROXIDE 400 MG/5ML PO SUSP: 30.0000 mL | Freq: Every day | ORAL | Status: DC | PRN

## 2014-06-01 NOTE — Consult Note (Signed)
Telepsychiatry Consult  Subjective: Pt seen and chart reviewed. Pt is denying SI, HI, and AVH. However, pt is inconsistent with his subjective reporting and is known to have been dishonest with multiple staff members about his drug use, finally admitted that he uses multiple substances after being told that his drug screen was positive for such. Pt then reported that he "didn't think it would show up on there". Pt reportedly took 10 doses or more of heroin after being upset and feeling overwhelmed when his parents threatened to tell his boss about drug use and get him fired. Pt is minimizing symptoms and drug use to this NP by stating "I don't have any type of drug problem, I'm fine," yet pt told staff members he wanted detox treatment if possible. IVC papers were taken out for suicidal ideation. With collateral information on medical chart stating that parents as well as Designer, television/film setlaw enforcement officers, heard pt make multiple statements about suicidal ideation, pt is a risk to himself and must be hospitalized with IVC upheld.   HPI:  Dustin Case is an 23 y.o. male. -Pt was seen by Dr. Vanetta MuldersScott Zackowski.  Patient initially came in to get help for detoxing from heroin.   He wanted inpatient services initially.  After awhile he decided that he did not.  Patient went home.  Patient's labs had been drawn and he was positive for THC, cocaine, benzos & amphetamines on his UDS.  Patient's parents went and IVC'ed him and he was brought back to [facility].  Parents are concerned about his drug use and that he may intentionally overdose on heroin.  Patient initially tried to deny that he was using illicit drugs.  Clinician pointed out that the UDS showed the drugs in his system.  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  Patient says he is regularly drug tested.  Patient initially denied using any opiates and  was confronted about his previously wanting inpatient detox from heroin.  Again he minimizes drug use by saying that he does not use heroin "that much."  Last use of heroin was 2 days ago patient reports.  Patient denies any SI, Hi or A/V hallucinations.  Patient denies any recurrent SI and no previous attempts.  Patient has no outpatient care and no inpatient history.-Pt care discussed with Donell SievertSpencer Simon, PA who recommended that psychiatry examine IVC papers and evaluate to uphold or rescind IVC papers.   Axis I: MDD, Substance Abuse and Substance Induced Mood Disorder, Opioid Dependence-Severe, Benzodiazepine Dependence-Severe Axis II: Deferred Axis III: History reviewed. No pertinent past medical history. Axis IV: other psychosocial or environmental problems and problems with primary support group Axis V: 41-50 serious symptoms  Psychiatric Specialty Exam: Physical Exam  ROS  Blood pressure 108/66, pulse 71, temperature 97.7 F (36.5 C), temperature source Oral, resp. rate 16, height 5\' 10"  (1.778 m), weight 68.04 kg (150 lb), SpO2 100 %.Body mass index is 21.52 kg/(m^2).  General Appearance: Casual  Eye Contact::  Fair  Speech:  Pressured  Volume:  Increased  Mood:  Anxious and Depressed  Affect:  Restricted  Thought Process:  Coherent  Orientation:  Full (Time, Place, and Person)  Thought Content:  Rumination about parents "trying to make me get treatment"  Suicidal Thoughts:  Yes.  with intent/plan although minimizing  Homicidal Thoughts:  No  Memory:  Immediate;  Fair Recent;   Fair Remote;   Fair  Judgement:  Fair  Insight:  Fair  Psychomotor Activity:  Increased  Concentration:  Fair  Recall:  Fair  Akathisia:  No  Handed:    AIMS (if indicated):     Assets:  Desire for Improvement Social Support  Sleep:         Past Medical History: History reviewed. No pertinent past medical history.  History reviewed. No pertinent past surgical history.  Family History:  History reviewed. No pertinent family history.  Social History:  reports that he has never smoked. He does not have any smokeless tobacco history on file. He reports that he drinks alcohol. He reports that he does not use illicit drugs.  Additional Social History:  Alcohol / Drug Use Pain Medications: Pt denies use of opiates Prescriptions: No prescription drugs.  Patient is positive for benzos, amphetamine, cocaine & THC Over the Counter: Getting xanax imitations off the internet. History of alcohol / drug use?: Yes Substance #1 Name of Substance 1: Amphetamies (adderall0 1 - Age of First Use: 23 years of age 440 - Amount (size/oz): One pill (does not know dosage) 1 - Frequency: Once or twice per year. 1 - Duration: One year 1 - Last Use / Amount: Cannot remember Substance #2 Name of Substance 2: Benzos (xanax knock off from the internet) 2 - Age of First Use: 23 years of age 44 - Amount (size/oz): One use, unsure of amount 2 - Frequency: One use 2 - Duration: One use 2 - Last Use / Amount: Two days ago Substance #3 Name of Substance 3: Cocaine 3 - Age of First Use: 23 years of age 23 - Amount (size/oz): Two lines 3 - Frequency: One use 3 - Duration: One use  3 - Last Use / Amount: Two days ago Substance #4 Name of Substance 4: Marijuana 4 - Age of First Use: 23 years old 4 - Amount (size/oz): One joint 4 - Frequency: Once per year 4 - Duration: Last few months 4 - Last Use / Amount: One week ago.  CIWA: CIWA-Ar BP: 108/66 mmHg Pulse Rate: 71 COWS:    PATIENT STRENGTHS: (choose at least two) Capable of independent living Communication skills Supportive family/friends  Allergies: No Known Allergies  Home Medications:  (Not in a hospital admission)  Plan: -Admit to inpatient facility for psychiatric hospitalization for suicidal ideation. [facility] is appropriate if beds available.  -Utilize Clonidine opiate withdrawal protocol immediately and continuously until pt [facility]  Disposition:  Initial Assessment Completed for this Encounter: Yes Disposition of Patient: Other dispositions  Beau FannyWithrow, John C, FNP-BC 06/01/2014 10:41 AM  Case/ dispo recommendations discussed with me as above

## 2014-06-01 NOTE — ED Notes (Signed)
TTS monitor set up in room.

## 2014-06-01 NOTE — Progress Notes (Signed)
Invol admit, 23 yo caucasian male, here for detox from polysub abuse.  Pt was petitioned by his parents with whom he lives.  They are concerned that pt may try to harm himself, but pt denies SI/HI/AV. Pt is minimizing his use, saying he only uses occasionally, no more than a few times in the past year, and says he only started using last year at age 23.  Pt was positive for amphetamines, benzos, cocaine, and THC.  Pt denies any major medical issues.  He says he is having restless legs with prickly/tingling sensations at this time.  He denies any other withdrawal symptoms.  Pt was anxious, but cooperative/pleasant with the admission process.  Pt understands why he is here and was encouraged to voice his needs or concerns to staff.  Pt was oriented to unit/room.  Safety checks q15 minutes were initiated.

## 2014-06-01 NOTE — ED Notes (Signed)
[facility]

## 2014-06-01 NOTE — Tx Team (Signed)
Initial Interdisciplinary Treatment Plan   PATIENT STRESSORS: Marital or family conflict Substance abuse   PROBLEM LIST: Problem List/Patient Goals Date to be addressed Date deferred Reason deferred Estimated date of resolution  Pt states he wants to detox and then return to his parents home.  He does not want long term treatment.  He states he does not use drugs daily and he is not suicidal or homicidal.        Conflict with parents with whom he lives                                                 DISCHARGE CRITERIA:  Ability to meet basic life and health needs Improved stabilization in mood, thinking, and/or behavior Motivation to continue treatment in a less acute level of care Verbal commitment to aftercare and medication compliance Withdrawal symptoms are absent or subacute and managed without 24-hour nursing intervention  PRELIMINARY DISCHARGE PLAN: Attend 12-step recovery group Outpatient therapy Return to previous living arrangement Return to previous work or school arrangements  PATIENT/FAMIILY INVOLVEMENT: This treatment plan has been presented to and reviewed with the patient, Dustin Case, and/or family member.  The patient and family have been given the opportunity to ask questions and make suggestions.  Dustin Case, Dustin Case [facility] 06/01/2014, 10:28 PM

## 2014-06-01 NOTE — ED Notes (Signed)
[facility]

## 2014-06-01 NOTE — Progress Notes (Signed)
[facility]. [facility].   Derrell Lollingoris Yasheka Fossett, MSW  Social Worker [phone]

## 2014-06-01 NOTE — ED Notes (Signed)
Pt transported to [facility].

## 2014-06-01 NOTE — ED Notes (Signed)
Pt asking sitter if we know how much longer, advised have been trying to [facility].

## 2014-06-01 NOTE — BH Assessment (Signed)
Tele Assessment Note   Dustin FleetJamie C Case is an 23 y.o. male.  -Pt was seen by Dr. Vanetta MuldersScott Zackowski.  Patient initially came in to get help for detoxing from heroin.   He wanted inpatient services initially.  After awhile he decided that he did not.  Patient went home.  Patient's labs had been drawn and he was positive for THC, cocaine, benzos & amphetamines on his UDS.  Patient's parents went and IVC'ed him and he was brought back to [facility].  Parents are concerned about his drug use and that he may intentionally overdose on heroin.  Patient initially tried to deny that he was using illicit drugs.  Clinician pointed out that the UDS showed the drugs in his system.  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  Patient says he is regularly drug tested.  Patient initially denied using any opiates and was confronted about his previously wanting inpatient detox from heroin.  Again he minimizes drug use by saying that he does not use heroin "that much."  Last use of heroin was 2 days ago patient reports.  Patient denies any SI, Hi or A/V hallucinations.  Patient denies any recurrent SI and no previous attempts.  Patient has no outpatient care and no inpatient history.  -Pt care discussed with Donell SievertSpencer Simon, PA who recommended that psychiatry examine IVC papers and evaluate to uphold or rescind IVC papers.  Axis I: Substance Abuse Axis II: Deferred Axis III: History reviewed. No pertinent past medical history. Axis IV: other psychosocial or environmental problems and problems with primary support group Axis V: 41-50 serious symptoms  Past Medical History: History reviewed. No pertinent past medical history.  History reviewed. No pertinent past surgical history.  Family History: History reviewed. No pertinent family history.  Social History:  reports that he has never smoked. He does not  have any smokeless tobacco history on file. He reports that he drinks alcohol. He reports that he does not use illicit drugs.  Additional Social History:  Alcohol / Drug Use Pain Medications: Pt denies use of opiates Prescriptions: No prescription drugs.  Patient is positive for benzos, amphetamine, cocaine & THC Over the Counter: Getting xanax imitations off the internet. History of alcohol / drug use?: Yes Substance #1 Name of Substance 1: Amphetamies (adderall0 1 - Age of First Use: 23 years of age 7 - Amount (size/oz): One pill (does not know dosage) 1 - Frequency: Once or twice per year. 1 - Duration: One year 1 - Last Use / Amount: Cannot remember Substance #2 Name of Substance 2: Benzos (xanax knock off from the internet) 2 - Age of First Use: 23 years of age 64 - Amount (size/oz): One use, unsure of amount 2 - Frequency: One use 2 - Duration: One use 2 - Last Use / Amount: Two days ago Substance #3 Name of Substance 3: Cocaine 3 - Age of First Use: 23 years of age 23 - Amount (size/oz): Two lines 3 - Frequency: One use 3 - Duration: One use  3 - Last Use / Amount: Two days ago Substance #4 Name of Substance 4: Marijuana 4 - Age of First Use: 23 years old 4 - Amount (size/oz): One joint 4 - Frequency: Once per year 4 - Duration: Last few months 4 - Last Use / Amount: One week ago.  CIWA: CIWA-Ar BP: 108/66  mmHg Pulse Rate: 71 COWS:    PATIENT STRENGTHS: (choose at least two) Capable of independent living Communication skills Supportive family/friends  Allergies: No Known Allergies  Home Medications:  (Not in a hospital admission)  OB/GYN Status:  No LMP for male patient.  General Assessment Data Location of Assessment: [facility] Is this a Tele or Face-to-Face Assessment?: Tele Assessment Is this an Initial Assessment or a Re-assessment for this encounter?: Initial Assessment Living Arrangements: Parent Can pt return to current living arrangement?:  Yes Admission Status: Involuntary Is patient capable of signing voluntary admission?: Yes (Pt on IVC) Transfer from: Acute Hospital Referral Source: Self/Family/Friend     [facility] Crisis Care Plan Living Arrangements: Parent Name of Psychiatrist: N/A Name of Therapist: N/A  Education Status Highest grade of school patient has completed: Some college  Risk to self with the past 6 months Suicidal Ideation: No Suicidal Intent: No Is patient at risk for suicide?: No Suicidal Plan?: No Access to Means: No What has been your use of drugs/alcohol within the last 12 months?: Benzos, amphetamines, THC, cocaine in UDS. Previous Attempts/Gestures: No How many times?: 0 Other Self Harm Risks: N/A Triggers for Past Attempts: None known Intentional Self Injurious Behavior: None Family Suicide History: Unknown Recent stressful life event(s): Other (Comment) (Pt cannot identify a stressor.) Persecutory voices/beliefs?: No Depression: No Depression Symptoms:  (Pt denies depressive symptoms) Substance abuse history and/or treatment for substance abuse?: Yes Suicide prevention information given to non-admitted patients: Not applicable  Risk to Others within the past 6 months Homicidal Ideation: No Thoughts of Harm to Others: No Current Homicidal Intent: No Current Homicidal Plan: No Access to Homicidal Means: No Identified Victim: No one History of harm to others?: No Assessment of Violence: None Noted Violent Behavior Description: Pt denies.  Is calm and cooperative. Does patient have access to weapons?: Yes (Comment) ("I own guns.") Criminal Charges Pending?: No Does patient have a court date: No  Psychosis Hallucinations: None noted Delusions: None noted  Mental Status Report Appear/Hygiene: Unremarkable, In scrubs Eye Contact: Fair Motor Activity: Freedom of movement, Unremarkable Speech: Logical/coherent Level of Consciousness: Alert Mood: Pleasant Affect:  Apprehensive Anxiety Level: Minimal Thought Processes: Coherent, Relevant Judgement: Unimpaired Orientation: Person, Place, Situation Obsessive Compulsive Thoughts/Behaviors: None  Cognitive Functioning Concentration: Normal Memory: Recent Intact, Remote Intact IQ: Average Insight: Poor Impulse Control: Poor Appetite: Good Weight Loss: 0 Weight Gain: 0 Sleep: No Change Total Hours of Sleep: 8 Vegetative Symptoms: None  ADLScreening [facility] Assessment Services) Patient's cognitive ability adequate to safely complete daily activities?: Yes Patient able to express need for assistance with ADLs?: Yes Independently performs ADLs?: Yes (appropriate for developmental age)  Prior Inpatient Therapy Prior Inpatient Therapy: No Prior Therapy Dates: None Prior Therapy Facilty/Provider(s): [place] Reason for Treatment: None  Prior Outpatient Therapy Prior Outpatient Therapy: No Prior Therapy Dates: N/a Prior Therapy Facilty/Provider(s): N/A Reason for Treatment: N/A  ADL Screening (condition at time of admission) Patient's cognitive ability adequate to safely complete daily activities?: Yes Is the patient deaf or have difficulty hearing?: No Does the patient have difficulty seeing, even when wearing glasses/contacts?: No Does the patient have difficulty concentrating, remembering, or making decisions?: No Patient able to express need for assistance with ADLs?: Yes Does the patient have difficulty dressing or bathing?: No Independently performs ADLs?: Yes (appropriate for developmental age) Does the patient have difficulty walking or climbing stairs?: No Weakness of Legs: None Weakness of Arms/Hands: None  Home Assistive Devices/Equipment Home Assistive Devices/Equipment: None  Abuse/Neglect Assessment (Assessment to be complete while patient is alone) Physical Abuse: Denies Verbal Abuse: Denies Sexual Abuse: Denies Exploitation of patient/patient's resources:  Denies Self-Neglect: Denies     Merchant navy officerAdvance Directives (For Healthcare) Does patient have an advance directive?: No Would patient like information on creating an advanced directive?: No - patient declined information    Additional Information 1:1 In Past 12 Months?: No CIRT Risk: No Elopement Risk: No Does patient have medical clearance?: Yes     Disposition:  Disposition Initial Assessment Completed for this Encounter: Yes Disposition of Patient: Other dispositions Other disposition(s): Other (Comment) (To be seen by psychiatry to up hold or recind IVC.)  Alexandria LodgeHarvey, Reshawn Ostlund Ray 06/01/2014 7:49 AM

## 2014-06-01 NOTE — ED Notes (Signed)
Pt resting calmly w/ eyes closed. Rise & fall of the chest noted. Bed in low position, side rails up x2. NAD noted at this time.  

## 2014-06-01 NOTE — ED Notes (Signed)
Pt. Stating that during telepsych he was told that IVC would be rescinded.

## 2014-06-01 NOTE — Progress Notes (Signed)
Pt to be reassessed by Conrad Withrow, NP.  Myna Freimark, MSW  Social Worker [phone]    

## 2014-06-01 NOTE — ED Provider Notes (Signed)
[facility] by Dr. Dub MikesLugo  He will be transferred by law enforcement  Dustin MelterElliott L [name], MD 06/01/14 1730

## 2014-06-01 NOTE — ED Notes (Signed)
[facility]

## 2014-06-02 ENCOUNTER — Encounter (HOSPITAL_COMMUNITY): Payer: Self-pay | Admitting: Psychiatry

## 2014-06-02 DIAGNOSIS — F1190 Opioid use, unspecified, uncomplicated: Secondary | ICD-10-CM

## 2014-06-02 DIAGNOSIS — F1099 Alcohol use, unspecified with unspecified alcohol-induced disorder: Secondary | ICD-10-CM

## 2014-06-02 DIAGNOSIS — F11959 Opioid use, unspecified with opioid-induced psychotic disorder, unspecified: Secondary | ICD-10-CM

## 2014-06-02 DIAGNOSIS — F1290 Cannabis use, unspecified, uncomplicated: Secondary | ICD-10-CM

## 2014-06-02 DIAGNOSIS — F321 Major depressive disorder, single episode, moderate: Secondary | ICD-10-CM

## 2014-06-02 DIAGNOSIS — F1920 Other psychoactive substance dependence, uncomplicated: Secondary | ICD-10-CM

## 2014-06-02 DIAGNOSIS — F1390 Sedative, hypnotic, or anxiolytic use, unspecified, uncomplicated: Secondary | ICD-10-CM

## 2014-06-02 DIAGNOSIS — F1120 Opioid dependence, uncomplicated: Secondary | ICD-10-CM | POA: Diagnosis present

## 2014-06-02 NOTE — [facility] Group Notes (Signed)
[facility] LCSW Group Therapy 06/02/2014  1:15 PM Type of Therapy: Group Therapy Participation Level: Active  Participation Quality: Attentive, Sharing and Supportive  Affect: Depressed and Flat  Cognitive: Alert and Oriented  Insight: Developing/Improving and Engaged  Engagement in Therapy: Developing/Improving and Engaged  Modes of Intervention: Clarification, Confrontation, Discussion, Education, Exploration, Limit-setting, Orientation, Problem-solving, Rapport Building, Dance movement psychotherapisteality Testing, Socialization and Support  Summary of Progress/Problems: The topic for group today was emotional regulation. This group focused on both positive and negative emotion identification and allowed group members to process ways to identify feelings, regulate negative emotions, and find healthy ways to manage internal/external emotions. Group members were asked to reflect on a time when their reaction to an emotion led to a negative outcome and explored how alternative responses using emotion regulation would have benefited them. Group members were also asked to discuss a time when emotion regulation was utilized when a negative emotion was experienced. Patient discussed his tendency to do everything to the "extreme" including his drug use. Patient discussed liking the thrill of drug use or risky behaviors. Patient denies being interested in treatment at this time but shared that he does not want to continue using. CSW's and other group members provided emotional support and encouragement.  Samuella BruinKristin Toryn Dewalt, MSW, [employer]CSWA Clinical Social Worker [facility] [phone]

## 2014-06-02 NOTE — H&P (Signed)
Psychiatric Admission Assessment Adult  Patient Identification:  Dustin Case Date of Evaluation:  06/02/2014 Chief Complaint:  POLYSUBSTANCE DEPENDENCY History of Present Illness:: 23 Y/O male who states he started shooting heroin about a year ago. States he used to use pain pills once in a while. Alcohol 3 beers in a week. Has tried almost everything heroin was the one thing that got him. States parents found him unresponsive with a needle in his arm. He admits he abuses benzodiazepines and uses heroin and benzodiazepine together. 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 However, pt is inconsistent with his subjective reporting and is known to have been dishonest with multiple staff members about his drug use, finally admitted that he uses multiple substances after being told that his drug screen was positive for such. Pt then reported that he "didn't think it would show up on there". Pt reportedly took 10 doses or more of heroin after being upset and feeling overwhelmed when his parents threatened to tell his boss about drug use and get him fired. Pt is minimizing symptoms and drug use to this NP by stating "I don't have any type of drug problem, I'm fine," yet pt told staff members he wanted detox treatment if possible. IVC papers were taken out for suicidal ideation. With collateral information on medical chart stating that parents as well as Administrator, heard pt make multiple statements about suicidal ideation, pt is a risk to himself and must be hospitalized with IVC upheld.   HPI: Dustin Case is an 23 y.o. male. -Pt was seen by Dr. Fredia Sorrow. Patient initially came in to get help for detoxing from heroin. He wanted inpatient services initially. After awhile he decided that he did not. Patient went home. Patient's labs had been drawn and he  was positive for THC, cocaine, benzos & amphetamines on his UDS. Patient's parents went and IVC'ed him and he was brought back to [facility]. Parents are concerned about his drug use and that he may intentionally overdose on heroin.  Patient initially tried to deny that he was using illicit drugs. Clinician pointed out that the UDS showed the drugs in his system. 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 Patient says he is regularly drug tested. Patient initially denied using any opiates and was confronted about his previously wanting inpatient detox from heroin. Again he minimizes drug use by saying that he does not use heroin "that much." Last use of heroin was 2 days ago patient reports.  Associated Signs/Synptoms: Depression Symptoms:  Denies (Hypo) Manic Symptoms:  Denies Anxiety Symptoms:  Denies Psychotic Symptoms:  Denies PTSD Symptoms: NA Total Time spent with patient: 45 minutes  Psychiatric Specialty Exam: Physical Exam  Review of Systems  Constitutional: Negative.   Eyes: Negative.   Respiratory: Negative.   Cardiovascular: Negative.   Gastrointestinal: Positive for heartburn and diarrhea.  Genitourinary: Negative.   Musculoskeletal: Positive for myalgias, back pain and joint pain.  Skin: Negative.   Neurological: Positive for dizziness and headaches.  Endo/Heme/Allergies: Negative.   Psychiatric/Behavioral: Positive for substance abuse.    Blood pressure 108/60, pulse 115, temperature 98.1 F (36.7 C), temperature source Oral, resp. rate 16, height 5'  8" (1.727 m), weight 63.957 kg (141 lb), SpO2 100 %.Body mass index is 21.44 kg/(m^2).  General Appearance: Fairly Groomed  Engineer, water::  Fair  Speech:  Clear and Coherent  Volume:  Normal  Mood:  Anxious and worried  Affect:  anxious, worried  Thought Process:  Coherent and Goal Directed   Orientation:  Full (Time, Place, and Person)  Thought Content:  events symptoms worries concerns (still minimizing)  Suicidal Thoughts:  No  Homicidal Thoughts:  No  Memory:  Immediate;   Fair Recent;   Fair Remote;   Fair  Judgement:  Fair  Insight:  Shallow  Psychomotor Activity:  Restlessness  Concentration:  Fair  Recall:  [employer] [place]: Fair  Akathisia:  No  Handed:    AIMS (if indicated):     Assets:  Housing Microbiologist Vocational/Educational  Sleep:       Musculoskeletal: Strength & Muscle Tone: within normal limits Gait & Station: normal Patient leans: N/A  Past Psychiatric History: Diagnosis:  Hospitalizations: Denies  Outpatient Care: Denies  Substance Abuse Care:Denies  Self-Mutilation: Denies  Suicidal Attempts:Denies  Violent Behaviors: Denies   Past Medical History:  History reviewed. No pertinent past medical history. Scoliosis Allergies:  No Known Allergies PTA Medications: No prescriptions prior to admission    Previous Psychotropic Medications:  Medication/Dose    Initially diagnosed ADHD as a kid used stimulants but did not like them             Substance Abuse History in the last 12 months:  Yes.    Consequences of Substance Abuse: Blackouts:   Withdrawal Symptoms:   Diarrhea Nausea aches pains restless leg  Social History:  reports that he has never smoked. He uses smokeless tobacco. He reports that he drinks alcohol. He reports that he does not use illicit drugs. Additional Social History: Pain Medications: Pt denies  Prescriptions: Pt denies Over the Counter: Pt denies History of alcohol / drug use?: Yes Negative Consequences of Use: Personal relationships, Work / Youth worker Withdrawal Symptoms: Agitation, Irritability, Patient aware of relationship between substance abuse and physical/medical complications, Tingling Name of Substance 1: Amphetamines(Adderall) 1 - Age of First Use:  22 1 - Amount (size/oz): one pill 1 - Frequency: "once or twice a year" 1 - Duration: about a year 1 - Last Use / Amount: don't remember Name of Substance 2: Benzos(xanax off the internet) 2 - Age of First Use: 22 2 - Amount (size/oz): used once 2 - Frequency: used once 2 - Duration: used once 2 - Last Use / Amount: 2 days ago Name of Substance 3: cocaine 3 - Age of First Use: 22 3 - Amount (size/oz): two lines 3 - Frequency: used once 3 - Duration: used once 3 - Last Use / Amount: two days ago Name of Substance 4: THC 4 - Age of First Use: 23 4 - Amount (size/oz): one joint 4 - Frequency: "once or twice a year" 4 - Duration: last few weeks 4 - Last Use / Amount: a week ago            Current Place of Residence:   Place of Birth:   Family Members: Marital Status:  Single Children:  Sons:  Daughters: Relationships: Education:  Secretary/administrator some, going for Librarian, academic [employer] Educational Problems/Performance:  Religious Beliefs/Practices:  History of Abuse (Emotional/Phsycial/Sexual) denies Pensions consultant; 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 Legal History: not currently  Hobbies/Interests:  Family History:  History reviewed. No pertinent family history.                             Denies history of any psychiatric disorders in the family Results for orders placed or performed during the hospital encounter of 05/31/14 (from the past 72 hour(s))  CBC with Differential     Status: Abnormal   Collection Time: 05/31/14  3:11 PM  Result Value Ref Range   WBC 6.2 4.0 - 10.5 K/uL   RBC 4.39 4.22 - 5.81 MIL/uL   Hemoglobin 13.1 13.0 - 17.0 g/dL   HCT 38.4 (L) 39.0 - 52.0 %   MCV 87.5 78.0 - 100.0 fL   MCH 29.8 26.0 - 34.0 pg   MCHC 34.1 30.0 - 36.0 g/dL   RDW 13.4 11.5 - 15.5 %   Platelets 195 150 - 400 K/uL   Neutrophils Relative % 61 43 - 77 %   Neutro Abs 3.8 1.7 - 7.7 K/uL   Lymphocytes Relative 29 12 - 46 %   Lymphs  Abs 1.8 0.7 - 4.0 K/uL   Monocytes Relative 9 3 - 12 %   Monocytes Absolute 0.6 0.1 - 1.0 K/uL   Eosinophils Relative 1 0 - 5 %   Eosinophils Absolute 0.1 0.0 - 0.7 K/uL   Basophils Relative 0 0 - 1 %   Basophils Absolute 0.0 0.0 - 0.1 K/uL  Comprehensive metabolic panel     Status: Abnormal   Collection Time: 05/31/14  3:11 PM  Result Value Ref Range   Sodium 142 137 - 147 mEq/L   Potassium 3.6 (L) 3.7 - 5.3 mEq/L   Chloride 104 96 - 112 mEq/L   CO2 26 19 - 32 mEq/L   Glucose, Bld 93 70 - 99 mg/dL   BUN 10 6 - 23 mg/dL   Creatinine, Ser 1.03 0.50 - 1.35 mg/dL   Calcium 9.6 8.4 - 10.5 mg/dL   Total Protein 7.5 6.0 - 8.3 g/dL   Albumin 4.3 3.5 - 5.2 g/dL   AST 78 (H) 0 - 37 U/L   ALT 195 (H) 0 - 53 U/L   Alkaline Phosphatase 110 39 - 117 U/L   Total Bilirubin 0.3 0.3 - 1.2 mg/dL   GFR calc non Af Amer >90 >90 mL/min   GFR calc Af Amer >90 >90 mL/min    Comment: (NOTE) The eGFR has been calculated using the CKD EPI equation. This calculation has not been validated in all clinical situations. eGFR's persistently <90 mL/min signify possible Chronic Kidney Disease.    Anion gap 12 5 - 15  Ethanol     Status: None   Collection Time: 05/31/14  3:11 PM  Result Value Ref Range   Alcohol, Ethyl (B) <11 0 - 11 mg/dL    Comment:        LOWEST DETECTABLE LIMIT FOR SERUM ALCOHOL IS 11 mg/dL FOR MEDICAL PURPOSES ONLY   Urine rapid drug screen (hosp performed)     Status: Abnormal   Collection Time: 05/31/14  5:49 PM  Result Value Ref Range   Opiates NONE DETECTED NONE DETECTED   Cocaine POSITIVE (A) NONE DETECTED   Benzodiazepines POSITIVE (A) NONE DETECTED   Amphetamines POSITIVE (A) NONE DETECTED   Tetrahydrocannabinol POSITIVE (A) NONE DETECTED   Barbiturates NONE DETECTED NONE DETECTED    Comment:        DRUG SCREEN FOR MEDICAL PURPOSES ONLY.  IF CONFIRMATION  IS NEEDED FOR ANY PURPOSE, NOTIFY LAB WITHIN 5 DAYS.        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 No pertinent past medical history. AXIS IV:  other psychosocial or environmental problems AXIS V:  41-50 serious symptoms  Treatment Plan/Recommendations:  Supportive approach/coping skills/relapse prevention                                                                 Detox as needed                                                                 Reassess and address the co morbidities  Treatment Plan Summary: Daily contact with patient to assess and evaluate symptoms and progress in treatment Medication management Current Medications:  Current Facility-Administered Medications  Medication Dose Route Frequency Provider Last Rate Last Dose  . acetaminophen (TYLENOL) tablet 650 mg  650 mg Oral Q6H PRN Laverle Hobby, PA-C      . alum & mag hydroxide-simeth (MAALOX/MYLANTA) 200-200-20 MG/5ML suspension 30 mL  30 mL Oral Q4H PRN Laverle Hobby, PA-C      . hydrOXYzine (ATARAX/VISTARIL) tablet 25 mg  25 mg Oral Q6H PRN Laverle Hobby, PA-C      . loperamide (IMODIUM) capsule 2-4 mg  2-4 mg Oral PRN Laverle Hobby, PA-C      . LORazepam (ATIVAN) tablet 1 mg  1 mg Oral Q6H PRN Laverle Hobby, PA-C      . LORazepam (ATIVAN) tablet 1 mg  1 mg Oral QID Laverle Hobby, PA-C   1 mg at 06/02/14 4034   Followed by  . [START ON 06/03/2014] LORazepam (ATIVAN) tablet 1 mg  1 mg Oral TID Laverle Hobby, PA-C       Followed by  . [START ON  06/04/2014] LORazepam (ATIVAN) tablet 1 mg  1 mg Oral BID Laverle Hobby, PA-C       Followed by  . [START ON 06/05/2014] LORazepam (ATIVAN) tablet 1 mg  1 mg Oral Daily Spencer E Simon, PA-C      . magnesium hydroxide (MILK OF MAGNESIA) suspension 30 mL  30 mL Oral Daily PRN Laverle Hobby, PA-C      . multivitamin with minerals tablet 1  tablet  1 tablet Oral Daily Laverle Hobby, PA-C   1 tablet at 06/02/14 [phone]  . ondansetron (ZOFRAN-ODT) disintegrating tablet 4 mg  4 mg Oral Q6H PRN Laverle Hobby, PA-C      . potassium chloride SA (K-DUR,KLOR-CON) CR tablet 20 mEq  20 mEq Oral BID Laverle Hobby, PA-C   20 mEq at 06/02/14 0843  . thiamine (B-1) injection 100 mg  100 mg Intramuscular Once Laverle Hobby, PA-C   100 mg at 06/01/14 2205  . thiamine (VITAMIN B-1) tablet 100 mg  100 mg Oral Daily Laverle Hobby, PA-C   100 mg at 06/02/14 5361  . traZODone (DESYREL) tablet 50 mg  50 mg Oral QHS,MR X 1 Laverle Hobby, PA-C   50 mg at 06/01/14 2204    Observation Level/Precautions:  15 minute checks  Laboratory:  As per the ED and Acute Hepatitis  Psychotherapy:  Individual/group  Medications:  Ativan Detox  Consultations:    Discharge Concerns:  Need for rehab  Estimated LOS: 3-5 days  Other:     I certify that inpatient services furnished can reasonably be expected to improve the patient's condition.   Harrington A 11/4/201510:56 AM

## 2014-06-02 NOTE — [facility] Suicide Risk Assessment (Signed)
[facility] INPATIENT:  Family/Significant Other Suicide Prevention Education  Suicide Prevention Education:  Patient Refusal for Family/Significant Other Suicide Prevention Education: The patient Dustin Case has refused to provide written consent for family/significant other to be provided Family/Significant Other Suicide Prevention Education during admission and/or prior to discharge.  Physician notified. SPE reviewed with patient, brochure provided.  Jovita Persing, West CarboKristin L 06/02/2014, 1:01 PM

## 2014-06-02 NOTE — Progress Notes (Signed)
Adult Psychoeducational Group Note  Date:  06/02/2014 Time:  6:52 PM  Group Topic/Focus:  Wellness Toolbox:   The focus of this group is to discuss various aspects of wellness, balancing those aspects and exploring ways to increase the ability to experience wellness.  Patients will create a wellness toolbox for use upon discharge.  Participation Level:  Active  Participation Quality:  Appropriate  Affect:  Appropriate  Cognitive:  Appropriate  Insight: Appropriate  Engagement in Group:  Engaged  Modes of Intervention:  Education  Additional Comments:  Patient stated the positive change he would like to make in his life was that he was not going to pick up drugs again. 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 Patient also stated he would spend more time with his dad programming and more time hunting with his friends.  Merleen MillinerCataldo, Ivi Griffith Y 06/02/2014, 6:52 PM

## 2014-06-02 NOTE — Progress Notes (Signed)
D: Patient appropriate and cooperative with staff. Patient presents with anxious affect and mood. He reported on the self inventory sheet that his sleep, appetite and ability to concentrate are all good and energy level is normal. Patient rates depression, feelings of hopelessness and anxiety "0". Writer has observed that the patient has been up in the dayroom the majority of the day, attending groups and working on a puzzle with peers. Patient is compliant with current medication regimen.   A: Support and encouragement provided to patient. Scheduled medications administered per MD orders. Maintain Q15 minute checks for safety.  R: Patient receptive. Denies SI/HI and AVH. Patient remains safe.

## 2014-06-02 NOTE — [facility] Counselor (Signed)
Adult Comprehensive Assessment  Patient ID: Dustin Case, male   DOB: 09/24/1990, 23 y.o.   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 Family Relationships: N/A Surveyor, quantityinancial / Lack of resources (include bankruptcy): N/A Housing / Lack of housing: N/A Physical health (include injuries & life threatening diseases): scoliosis which causes back pain Social relationships: N/A Substance abuse: heroin use approixmately once a month- last use was 4 bags Bereavement / Loss: Grandfather died 1 year ago  Living/Environment/Situation:  Living Arrangements: Parent Living conditions (as described by patient or guardian): safe, supportive How long has patient lived in current situation?: 23 years What is atmosphere in current home: Comfortable, Supportive  Family History:  Marital status: Single Does patient have children?: No  Childhood History:  By whom was/is the patient raised?: Both parents Description of patient's relationship with caregiver when they were a child: great Patient's description of current relationship with people who raised him/her: good Does patient have siblings?: Yes Number of Siblings: 2 Description of patient's current relationship with siblings: Patient reports that he is close with his 2 brothers Did patient suffer any verbal/emotional/physical/sexual abuse as a child?: No Did patient suffer from severe childhood neglect?: No Has patient ever been sexually abused/assaulted/raped as an adolescent or adult?: No Was the patient ever a victim of a crime or a disaster?: No Witnessed domestic violence?: No Has patient been effected by domestic violence as an adult?: No  Education:  Highest grade of school patient has completed: Some college Currently a Consulting civil engineerstudent?:  No Learning disability?: No  Employment/Work Situation:   Employment situation: Employed Where is patient currently employed?: Naval architectTruck driver How long has patient been employed?: 1 year Patient's job has been impacted by current illness: No What is the longest time patient has a held a job?: 2 years Where was the patient employed at that time?: research and development at a Runner, 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 Has patient ever been in the Eli Lilly and Companymilitary?: No Has patient ever served in combat?: No  Financial Resources:   Financial resources: Income from employment Does patient have a representative payee or guardian?: No  Alcohol/Substance Abuse:   What has been your use of drugs/alcohol within the last 12 months?: heroin use approixmately once a month- last use was 4 bags If attempted suicide, did drugs/alcohol play a role in this?: No Alcohol/Substance Abuse Treatment Hx: Denies past history Has alcohol/substance abuse ever caused legal problems?: Yes (Possession charges for marijuana 3-4 years ago)  Social Support System:   Patient's Community Support System: Good Describe Community Support System: Family Type of faith/religion: Christian How does patient's faith help to cope with current illness?: N/A  Leisure/Recreation:   Leisure and Hobbies: hunting, fishing, Building services engineercomputers  Strengths/Needs:   What things does the patient do well?: electronics In what areas does patient struggle / problems for patient: dealing with confrontation and frustration  Discharge Plan:   Does patient have access to transportation?: Yes Will patient be returning to same living situation after discharge?: Yes Currently receiving community mental health services: No If no, would patient like referral for services when discharged?: No Does patient have financial barriers related to discharge medications?: No  Summary/Recommendations:     Patient is a 23 year old Caucasian Male with a diagnosis of substance abuse. [place] parents who are described as supportive.  Patient reports that he accidentally overdosed on heroin. He reports that he has been using heroin once a month for a year to help with back pain associated with scoliosis. Patient denies SI, depression, and anxiety. Patient declines for follow up at this time. Patient will benefit from crisis stabilization, medication evaluation, group therapy, and psycho education in addition to case management for discharge planning. Patient and CSW reviewed pt's identified goals and treatment plan. Pt verbalized understanding and agreed to treatment plan.   Dustin Case, [place] CarboKristin L. 06/02/2014

## 2014-06-02 NOTE — [facility] Group Notes (Signed)
   [facility]  06/02/2014  8:45 AM   Participation Quality: Alert, Appropriate and Oriented  Mood/Affect: Appropriate  Depression Rating: 1  Anxiety Rating: 1  Thoughts of Suicide: Pt denies SI/HI  Will you contract for safety? Yes  Current AVH: Pt denies  Plan for Discharge/Comments: Pt attended discharge planning group and actively participated in group. CSW provided pt with today's workbook. Patient reports feeling "good" today. He denies depression or anxiety. Patient reports that he is currently here due to heroin use but declines follow up services at this time.  Patient plans to return home with his parents at discharge.  Transportation Means: Pt reports access to transportation  Supports: Patient identified his parents as supportive.  Samuella BruinKristin Kishia Shackett, MSW, [employer] Inc[employer]CSWA Clinical Social Worker [facility] [phone]

## 2014-06-02 NOTE — Progress Notes (Signed)
Pt attended spiritual care group on grief and loss facilitated by counseling intern [place] Austin and Burnis KingfisherMatthew Rina Adney. Group opened with brief discussion and psycho-social ed around grief and loss in relationships and in relation to self - identifying life patterns, circumstances, changes that cause losses. Established group norm of speaking from own life experience. Group goal of establishing open and affirming space for members to share loss and experience with grief, normalize grief experience and provide psycho social education and grief support.  Dustin Case was pulled out of the group for the majority of the session. When Dustin Case reentered the group, counselor checked in with him and Dustin Case shared he had not experienced much grief in his life except the passing of his grandfather. Dustin Case stated his heroin use began after loosing his grandfather, but also denied any problems with his use. Dustin Case stated use was only once a month for fun, however, he has started to see some connection between use and grief. Dustin Case was very responsive to prompting to share about relationship with grandfather, and may benefit from further follow up with grief counseling.   [place]Jordan Austin Counseling Intern

## 2014-06-02 NOTE — [facility] Suicide Risk Assessment (Signed)
Suicide Risk Assessment  Admission Assessment     Nursing information obtained from:  Patient Demographic factors:  Male, Adolescent or young adult, Caucasian, Access to firearms Current Mental Status:  NA (Pt denies) Loss Factors:  NA Historical Factors:  NA Risk Reduction Factors:  Employed, Living with another person, especially a relative, Positive social support Total Time spent with patient: 45 minutes  CLINICAL FACTORS:   Alcohol/Substance Abuse/Dependencies  COGNITIVE FEATURES THAT CONTRIBUTE TO RISK:  Closed-mindedness Polarized thinking Thought constriction (tunnel vision)    SUICIDE RISK:   Mild:  Suicidal ideation of limited frequency, intensity, duration, and specificity.  There are no identifiable plans, no associated intent, mild dysphoria and related symptoms, good self-control (both objective and subjective assessment), few other risk factors, and identifiable protective factors, including available and accessible social support.  PLAN OF CARE: Supportive approach/coping skills/relapse prevention                               Detox/reassess and address the co morbidites  I certify that inpatient services furnished can reasonably be expected to improve the patient's condition.  Chauntelle Azpeitia A 06/02/2014, 5:47 PM

## 2014-06-02 NOTE — [facility] Group Notes (Signed)
Adult Psychoeducational Group Note  Date:  06/02/2014 Time:  10:21 PM  Group Topic/Focus:  NA Meeting  Participation Level:  Minimal  Participation Quality:  Attentive  Affect:  Flat  Cognitive:  Alert  Insight: Limited  Engagement in Group:  Lacking  Modes of Intervention:  Discussion and Education  Additional Comments:  Asher MuirJamie attended group.  Caroll RancherLindsay, Christyann Manolis A 06/02/2014, 10:21 PM

## 2014-06-03 LAB — HEPATITIS PANEL, ACUTE
HCV AB: REACTIVE — AB
HEP A IGM: NONREACTIVE
HEP B C IGM: NONREACTIVE
Hepatitis B Surface Ag: NEGATIVE

## 2014-06-03 MED ORDER — NAPROXEN 500 MG PO TABS
500.0000 mg | ORAL_TABLET | Freq: Two times a day (BID) | ORAL | Status: DC | PRN
  Administered 2014-06-03: 500 mg via ORAL
  Filled 2014-06-03: qty 1

## 2014-06-03 MED ORDER — HYDROXYZINE HCL 25 MG PO TABS
25.0000 mg | ORAL_TABLET | Freq: Four times a day (QID) | ORAL | Status: DC | PRN
  Filled 2014-06-03: qty 1
  Filled 2014-06-03: qty 56

## 2014-06-03 MED ORDER — METHOCARBAMOL 500 MG PO TABS
500.0000 mg | ORAL_TABLET | Freq: Three times a day (TID) | ORAL | Status: DC | PRN
  Administered 2014-06-03: 500 mg via ORAL
  Filled 2014-06-03: qty 1

## 2014-06-03 MED ORDER — CLONIDINE HCL 0.1 MG PO TABS: 0.1000 mg | ORAL_TABLET | Freq: Every day | ORAL | Status: DC

## 2014-06-03 MED ORDER — CHLORDIAZEPOXIDE HCL 25 MG PO CAPS
25.0000 mg | ORAL_CAPSULE | Freq: Four times a day (QID) | ORAL | Status: DC | PRN
  Administered 2014-06-03 – 2014-06-04 (×5): 25 mg via ORAL
  Filled 2014-06-03 (×5): qty 1

## 2014-06-03 MED ORDER — DICYCLOMINE HCL 20 MG PO TABS
20.0000 mg | ORAL_TABLET | Freq: Four times a day (QID) | ORAL | Status: DC | PRN
  Administered 2014-06-03: 20 mg via ORAL
  Filled 2014-06-03: qty 1

## 2014-06-03 MED ORDER — CLONIDINE HCL 0.1 MG PO TABS
0.1000 mg | ORAL_TABLET | Freq: Four times a day (QID) | ORAL | Status: DC
  Administered 2014-06-03 (×2): 0.1 mg via ORAL
  Filled 2014-06-03 (×9): qty 1

## 2014-06-03 MED ORDER — ONDANSETRON 4 MG PO TBDP: 4.0000 mg | ORAL_TABLET | Freq: Four times a day (QID) | ORAL | Status: DC | PRN

## 2014-06-03 MED ORDER — LOPERAMIDE HCL 2 MG PO CAPS
2.0000 mg | ORAL_CAPSULE | ORAL | Status: DC | PRN
  Administered 2014-06-03: 4 mg via ORAL
  Filled 2014-06-03: qty 2

## 2014-06-03 MED ORDER — CLONIDINE HCL 0.1 MG PO TABS
0.1000 mg | ORAL_TABLET | ORAL | Status: DC
  Filled 2014-06-03 (×2): qty 1

## 2014-06-03 MED ORDER — TRAZODONE HCL 100 MG PO TABS
100.0000 mg | ORAL_TABLET | Freq: Every evening | ORAL | Status: DC | PRN
  Administered 2014-06-03: 100 mg via ORAL
  Filled 2014-06-03: qty 28
  Filled 2014-06-03 (×3): qty 1
  Filled 2014-06-03: qty 28
  Filled 2014-06-03: qty 1

## 2014-06-03 NOTE — Progress Notes (Signed)
[facility] MD Progress Note  06/03/2014 4:58 PM Dustin Case  MRN:  [id number] Subjective:  Dustin Case is having a hard time. He did not sleep too well last night. He had been experiencing withdrawal. Restless legs, aches pains GI symptoms and a lot of anxiety. States that the Ativan is not helping. The result of his acute hepatitis test was discussed. It was positive for Hep C. Upset after listening to the results. He continues to assert that he cant to a rehab right now. 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   HENT: Negative.   Eyes: Negative.   Respiratory: Negative.   Cardiovascular: Negative.   Gastrointestinal: Positive for vomiting, abdominal pain and diarrhea.  Genitourinary: Negative.   Musculoskeletal: Positive for myalgias and back pain.  Skin: Negative.   Neurological: Negative.   Endo/Heme/Allergies: Negative.   Psychiatric/Behavioral: Positive for depression and substance abuse. The patient is nervous/anxious.     Blood pressure 120/81, pulse 102, temperature 97.9 F (36.6 C), temperature source Oral, resp. rate 16, height 5\' 8"  (1.727 m), weight 63.957 kg (141 lb), SpO2 100 %.Body mass index is 21.44 kg/(m^2).  General Appearance: Fairly Groomed  Patent attorney::  Fair  Speech:  Clear and Coherent  Volume:  Decreased  Mood:  Anxious, Depressed  and worried  Affect:  sad, anxious, worried  Thought Process:  Coherent and Goal Directed  Orientation:  Full (Time, Place, and Person)  Thought Content:  symptoms events worries concerns  Suicidal Thoughts:  No  Homicidal Thoughts:  No  Memory:  Immediate;   Fair Recent;   Fair Remote;   Fair  Judgement:  Fair  Insight:  Present  Psychomotor Activity:  Restlessness  Concentration:  Fair  Recall:  [employer] of Knowledge:Fair  Language: Fair  Akathisia:  No  Handed:    AIMS (if indicated):     Assets:  Desire for Improvement Housing Social Support Vocational/Educational  Sleep:  Number of Hours: 6   Musculoskeletal: Strength & Muscle Tone: within normal limits Gait & Station: normal Patient leans: N/A  Current Medications: Current Facility-Administered Medications  Medication Dose Route Frequency Provider Last Rate Last Dose  . acetaminophen (TYLENOL) tablet 650 mg  650 mg Oral Q6H PRN Kerry Hough, PA-C   650 mg at 06/02/14 2122  . alum & mag hydroxide-simeth (MAALOX/MYLANTA) 200-200-20 MG/5ML suspension 30 mL  30 mL Oral Q4H PRN Kerry Hough, PA-C      . chlordiazePOXIDE (LIBRIUM) capsule 25 mg  25 mg Oral QID PRN Rachael Fee, MD   25 mg at 06/03/14 1445  . cloNIDine (CATAPRES) tablet 0.1 mg  0.1 mg Oral QID Rachael Fee, MD   0.1 mg at 06/03/14 1156   Followed by  . [START ON 06/05/2014] cloNIDine (CATAPRES) tablet 0.1 mg  0.1 mg Oral BH-qamhs Rachael Fee, MD       Followed  by  . Melene Muller[START ON 06/08/2014] cloNIDine (CATAPRES) tablet 0.1 mg  0.1 mg Oral QAC breakfast Rachael FeeIrving A Glendia Olshefski, MD      . dicyclomine (BENTYL) tablet 20 mg  20 mg Oral Q6H PRN Rachael FeeIrving A Bryler Dibble, MD   20 mg at 06/03/14 1156  . hydrOXYzine (ATARAX/VISTARIL) tablet 25 mg  25 mg Oral Q6H PRN Rachael FeeIrving A Teralyn Mullins, MD      . loperamide (IMODIUM) capsule 2-4 mg  2-4 mg Oral PRN Rachael FeeIrving A Dashley Monts, MD   4 mg at 06/03/14 1156  . magnesium hydroxide (MILK OF MAGNESIA) suspension 30 mL  30 mL Oral Daily PRN Kerry HoughSpencer E  Simon, PA-C      . methocarbamol (ROBAXIN) tablet 500 mg  500 mg Oral Q8H PRN Rachael FeeIrving A Bobby Ragan, MD   500 mg at 06/03/14 1156  . multivitamin with minerals tablet 1 tablet  1 tablet Oral Daily Kerry HoughSpencer E Simon, PA-C   1 tablet at 06/03/14 [id number]  . naproxen (NAPROSYN) tablet 500 mg  500 mg Oral BID PRN Rachael FeeIrving A Noretta Frier, MD   500 mg at 06/03/14 1156  . ondansetron (ZOFRAN-ODT) disintegrating tablet 4 mg  4 mg Oral Q6H PRN Rachael FeeIrving A Jarryd Gratz, MD      . thiamine (B-1) injection 100 mg  100 mg Intramuscular Once Kerry HoughSpencer E Simon, PA-C   100 mg at 06/01/14 2205  . thiamine (VITAMIN B-1) tablet 100 mg  100 mg Oral Daily Kerry HoughSpencer E Simon, PA-C   100 mg at 06/03/14 [id number]  . traZODone (DESYREL) tablet 100 mg  100 mg Oral QHS,MR X 1 Rachael FeeIrving A Nasrin Lanzo, MD        Lab Results:  Results for orders placed or performed during the hospital encounter of 06/01/14 (from the past 48 hour(s))  Hepatitis panel, acute     Status: Abnormal   Collection Time: 06/02/14  7:47 PM  Result Value Ref Range   Hepatitis B Surface Ag NEGATIVE NEGATIVE   HCV Ab Reactive (A) NEGATIVE   Hep A IgM NON REACTIVE NON REACTIVE    Comment: (NOTE) Effective June 14, 2014, Hepatitis Acute Panel (test code [id number]) will be revised to automatically reflex to the Hepatitis C Viral RNA, Quantitative, Real-Time PCR assay if the Hepatitis C antibody screening result is Reactive. This action is being taken to ensure that the CDC/USPSTF recommended HCV diagnostic algorithm with the appropriate test reflex needed for accurate interpretation is followed.    Hep B C IgM NON REACTIVE NON REACTIVE    Comment: (NOTE) High levels of Hepatitis B Core IgM antibody are detectable during the acute stage of Hepatitis B. This antibody is used to differentiate current from past HBV infection. Performed at [facility]     Physical Findings: AIMS: Facial and Oral Movements Muscles of Facial Expression: None, normal Lips and Perioral Area: None, normal Jaw:  None, normal Tongue: None, normal,Extremity Movements Upper (arms, wrists, hands, fingers): None, normal Lower (legs, knees, ankles, toes): None, normal, Trunk Movements Neck, shoulders, hips: None, normal, Overall Severity Severity of abnormal movements (highest score from questions above): None, normal Incapacitation due to abnormal movements: None, normal Patient's awareness of abnormal movements (rate only patient's report): No Awareness, Dental Status Current problems with teeth and/or dentures?: No Does patient usually wear dentures?: No  CIWA:  CIWA-Ar Total: 3 COWS:  COWS Total Score: 4  Treatment Plan Summary: Daily contact with patient to assess and evaluate symptoms and progress in treatment Medication management  Plan: Supportive approach/coping skills/relapse prevention  Clonidine detox protocol to address the opioid withdrawal           Trial with Librium in lew of Ativan            Give information on Hep C  Medical Decision Making Problem Points:  New problem, with no additional work-up planned (3) and Review of psycho-social stressors (1) Data Points:  Review or order clinical lab tests (1) Review of medication regiment & side effects (2)  I certify that inpatient services furnished can reasonably be expected to improve the patient's condition.   Dorrell Mitcheltree A 06/03/2014, 4:58 PM

## 2014-06-03 NOTE — Progress Notes (Signed)
Patient ID: Dustin FleetJamie C Jobin, male   DOB: 09/05/1990, 23 y.o.   MRN: [id number] He has been up and about most of the day. Has requested and received prn medication for withdrawal symptoms. Self inventory: depression , hopelessness and anxiety )'s. Denies DI thoughts. Goal is to fine out when he could leave. He has attended groups today.

## 2014-06-03 NOTE — Progress Notes (Signed)
Recreation Therapy Notes  Animal-Assisted Activity/Therapy (AAA/T) Program Checklist/Progress Notes Patient Eligibility Criteria Checklist & Daily Group note for Rec Tx Intervention  Date: 11.04.2015 Time: 2:45pm Location: [address] Dayroom    AAA/T Program Assumption of Risk Form signed by Patient/ or Parent Legal Guardian yes  Patient is free of allergies or sever asthma yes  Patient reports no fear of animals yes  Patient reports no history of cruelty to animals yes   Patient understands his/her participation is voluntary yes  Patient washes hands before animal contact yes  Patient washes hands after animal contact yes  Behavioral Response: Appropriate, Engaged   Education: Charity fundraiserHand Washing, Appropriate Animal Interaction   Education Outcome: Acknowledges education.   Clinical Observations/Feedback: Patient engaged with therapy dog, petting him appropriately.   [name] [name], LRT/CTRS  Strother Everitt L 06/03/2014 4:01 PM

## 2014-06-03 NOTE — [facility] Group Notes (Signed)
[facility] LCSW Group Therapy 06/03/2014  1:15 pm   Type of Therapy: Group Therapy Participation Level: Active  Participation Quality: Attentive, Sharing and Supportive  Affect: Depressed and Flat  Cognitive: Alert and Oriented  Insight: Developing/Improving and Engaged  Engagement in Therapy: Developing/Improving and Engaged  Modes of Intervention: Clarification, Confrontation, Discussion, Education, Exploration, Limit-setting, Orientation, Problem-solving, Rapport Building, Dance movement psychotherapisteality Testing, Socialization and Support  Summary of Progress/Problems: The topic for group was balance in life. Today's group focused on defining balance in one's own words, identifying things that can knock one off balance, and exploring healthy ways to maintain balance in life. Group members were asked to provide an example of a time when they felt off balance, describe how they handled that situation,and process healthier ways to regain balance in the future. Group members were asked to share the most important tool for maintaining balance that [facility] and how they plan to apply this method after discharge. Patient actively listened but did not share during discussion of setting boundaries in negative relationships, utilizing support system, and prioritizing responsibilities.  Samuella BruinKristin Rue Valladares, MSW, [employer]CSWA Clinical Social Worker [facility] [phone]

## 2014-06-03 NOTE — Progress Notes (Signed)
Pt has been observed in the dayroom watching TV and interacting/talking with his peers.  Pt reports he is having mild to moderate withdrawal symptoms.  He says his legs still feel restless.  He asks about having his Ativan dose increased.  Pt reports his day has been fair.  He says he has attended most of the groups.  He is polite/cooperative with staff.  He denies SI/HI/AV.  He says he sees the need to stay off of drugs and plans to do that after detox.  Pt was encouraged to make his needs known.  He has been medicated as per protocol.  Support and encouragement offered.  Pt was encouraged to discuss his concerns about his medication with the MD tomorrow.  Safety maintained with q15 minute checks.

## 2014-06-03 NOTE — Progress Notes (Signed)
Adult Psychoeducational Group Note  Date:  06/03/2014 Time:  11:54 PM  Group Topic/Focus:  Wrap-Up Group:   The focus of this group is to help patients review their daily goal of treatment and discuss progress on daily workbooks.  Participation Level:  Active  Participation Quality:  Intrusive, Inattentive and Resistant  Affect:  Resistant  Cognitive:  Alert  Insight: Lacking  Engagement in Group:  Defensive, Off Topic and Resistant  Modes of Intervention:  Support  Additional Comments:  Pt attended wrap up group this evening.  MHT had difficulty redirecting pt during group.  He was asked to list one positive thing relating to his self esteem he stated,"I piss out excellence every morning."  MHT redirected pt stating that was inappropriate for group and language for the unit.  [facility]HT offered a [facility] rule book, pt declined reporting he already had one.  MHT redirected the pt again later in group for demeaning comments when others were sharing and asked him to step outside of group for additional comments he may have.  Dustin Case, Dustin Case 06/03/2014, 11:54 PM

## 2014-06-04 MED ORDER — TRAZODONE HCL 100 MG PO TABS: 100.0000 mg | ORAL_TABLET | Freq: Every evening | ORAL | Status: AC | PRN

## 2014-06-04 MED ORDER — HYDROXYZINE HCL 25 MG PO TABS: 25.0000 mg | ORAL_TABLET | Freq: Four times a day (QID) | ORAL | Status: AC | PRN

## 2014-06-04 NOTE — Tx Team (Signed)
Interdisciplinary Treatment Plan Update (Adult) Date: 06/04/2014   Time Reviewed: 9:30 AM  Progress in Treatment: Attending groups: Yes Participating in groups: Yes, minimally Taking medication as prescribed: Yes Tolerating medication: Yes Family/Significant other contact made: No, patient has declined for CSW to make collateral contact Patient understands diagnosis: Yes Discussing patient identified problems/goals with staff: Yes Medical problems stabilized or resolved: Yes Denies suicidal/homicidal ideation: Yes Issues/concerns per patient self-inventory: Yes Other:  New problem(s) identified: N/A  Discharge Plan or Barriers: Patient plans to return home at discharge, declines follow up services at this time. Patient continues to experience withdrawal symptoms.  Reason for Continuation of Hospitalization:  Depression Anxiety Medication Stabilization   Comments: N/A  Estimated length of stay: 2-3 days  For review of initial/current patient goals, please see plan of care.  Attendees:  Patient:    Family:    Physician: Dr. Jama Flavorsobos; Dr. Dub MikesLugo  06/04/2014 9:30 AM   Nursing: Quintella ReichertBeverly Knight; Robbie LouisVivian Kent; Kathi SimpersSarah Twyman, RN  06/04/2014 9:30 AM   Clinical Social Worker: Belenda CruiseKristin Lenus Trauger, LCSWA  06/04/2014 9:30 AM   Other: Juline PatchQuylle Hodnett, LCSW  06/04/2014 9:30 AM   Other: Leisa LenzValerie Enoch, Vesta [facility] Liaison  06/04/2014 9:30 AM   Other: Onnie BoerJennifer Clark, Case Manager 06/04/2014 9:30 AM   Other: Santa GeneraAnne Cunningham, LCSW  06/04/2014 9:30 AM   Other: Caroline SaugerElaina P., Pharmacist 06/04/2014 9:30 AM  Other:    Other:    Other:    Other:     Scribe for Treatment Team:  Samuella BruinKristin Anabia Weatherwax, MSW, [employer]CSWA [phone]

## 2014-06-04 NOTE — [facility] Group Notes (Signed)
[facility] LCSW Group Therapy 06/04/2014 1:15 PM Type of Therapy: Group Therapy Participation Level: Active  Participation Quality: Attentive and Supportive  Affect: Depressed and Flat  Cognitive: Alert and Oriented  Insight: Developing/Improving and Engaged  Engagement in Therapy: Developing/Improving and Engaged  Modes of Intervention: Clarification, Confrontation, Discussion, Education, Exploration, Limit-setting, Orientation, Problem-solving, Rapport Building, Dance movement psychotherapisteality Testing, Socialization and Support  Summary of Progress/Problems: The topic for today was feelings about relapse. Pt discussed what relapse prevention is to them and identified triggers that they are on the path to relapse. Pt processed their feeling towards relapse and was able to relate to peers. Pt discussed coping skills that can be used for relapse prevention. Patient actively listened during group but did not share on topic.   Samuella BruinKristin Nema Oatley, MSW, [employer]LCSWA Clinical Social Worker [facility] [phone]

## 2014-06-04 NOTE — Progress Notes (Signed)
Pt has been observed in the dayroom interacting with peers and playing cards before group time.  Conversation with pt was minimal.  He denies SI/HI/AV. He is still having mild to moderate withdrawal symptoms.  Previous shift nurse reported that pt was found to have chewing tobacco this afternoon which he says his roommate, that was discharged, gave to him.  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  Pt is still minimizing his drug abuse.  He is cooperative with staff.  Support and encouragement offered.  Pt makes his needs known to staff.  Safety maintained with q15 minute checks.

## 2014-06-04 NOTE — Discharge Summary (Signed)
Physician Discharge Summary Note  Patient:  Dustin FleetJamie C Case is an 23 y.o., male MRN:  [id number] DOB:  04/26/1991 Patient phone:  [phone] (home)  Patient address:   [address] [place] [id number],  Total Time spent with patient: 45 minutes  Date of Admission:  06/01/2014 Date of Discharge: 06/04/2014  Reason for Admission:  Substance abuse (heroine)  Discharge Diagnoses:  Substance induced mood disorder  Active Problems:   Substance induced mood disorder   Polysubstance (including opioids) dependence, binge pattern   Psychiatric Specialty Exam: Physical Exam  Vitals reviewed. Psychiatric: He has a normal mood and affect. His speech is normal and behavior is normal. Judgment and thought content normal. Cognition and memory are normal.    Review of Systems  Psychiatric/Behavioral: Positive for depression (chronic). Negative for suicidal ideas, hallucinations, memory loss and substance abuse. The patient is nervous/anxious and has insomnia.     Blood pressure 114/67, pulse 73, temperature 98 F (36.7 C), temperature source Oral, resp. rate 16, height 5\' 8"  (1.727 m), weight 63.957 kg (141 lb), SpO2 100 %.Body mass index is 21.44 kg/(m^2).     Musculoskeletal: Strength & Muscle Tone: within normal limits Gait & Station: normal Patient leans: N/A  DSM5:  Schizophrenia Disorders:  NA Obsessive-Compulsive Disorders:  NA Trauma-Stressor Disorders:  NA Substance/Addictive Disorders:  Opioid Disorder - Mild (305.50) Depressive Disorders:  NA  Axis Diagnosis:   AXIS I:  Substance Abuse and Substance Induced Mood Disorder AXIS II:  Deferred AXIS III:  History reviewed. No pertinent past medical history. AXIS IV:  economic problems, occupational problems and other psychosocial or environmental problems AXIS V:  61-70 mild symptoms  Level of Care:  OP  Hospital Course:   Dustin PicklesJamie Case is a 23 Y/O male who stated he started shooting heroin about a year ago. States he has  used to use pain pills, Alcohol, benzos in the past.  He initially tried to deny that he was using illicit drugs.  Clinician pointed out that the UDS showed the drugs in his system. 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 Patient says he is regularly drug tested. Patient initially denied using any opiates and was confronted about his previously wanting inpatient detox from heroin. Again he minimizes drug use by saying that he does not use heroin "that much." He last used of heroin was 2 days ago before presenting to the ED.  The initial assessment at the ED is as follows:Marland Kitchen. Pt is denying SI, HI, and AVH. However, pt is inconsistent with his subjective reporting and is known to have been dishonest with multiple staff members about his drug use, finally admitted that he uses multiple substances after being told that his drug screen was positive for such. Pt then reported that he "didn't think it would show up on there". Pt reportedly took 10 doses or more of heroin after being upset and feeling overwhelmed when his parents threatened to tell his boss about drug use and get him fired. Pt is minimizing symptoms and drug use to this NP by stating "I don't have any type of drug problem, I'm fine," yet pt told staff members he wanted detox treatment if possible. IVC papers were taken out for suicidal ideation. With collateral information on medical chart stating that parents as well as Designer, television/film setlaw enforcement officers, heard pt make  multiple statements about suicidal ideation, pt is a risk to himself and must be hospitalized with IVC upheld.   [facility].  At time of discharge, he rated both depression and anxiety levels to be manageable and minimal.  He was able to identify the triggers of his emotional crises and de-stabilizations.  He  identified the  positive things in his life that would help him deal better depression and polysubstance dependence.  He did well with the medications prescribed for him.  Denies physiological concerns/SI/HI/AVH at time of discharge.  He has satisfactory support network and home environment and will adhere to medication compliance and outpatient treatment.     Consults:  psychiatry  Significant Diagnostic Studies:  labs: Per ED  Discharge Vitals:   Blood pressure 114/67, pulse 73, temperature 98 F (36.7 C), temperature source Oral, resp. rate 16, height 5\' 8"  (1.727 m), weight 63.957 kg (141 lb), SpO2 100 %. Body mass index is 21.44 kg/(m^2). Lab Results:   Results for orders placed or performed during the hospital encounter of 06/01/14 (from the past 72 hour(s))  Hepatitis panel, acute     Status: Abnormal   Collection Time: 06/02/14  7:47 PM  Result Value Ref Range   Hepatitis B Surface Ag NEGATIVE NEGATIVE   HCV Ab Reactive (A) NEGATIVE   Hep A IgM NON REACTIVE NON REACTIVE    Comment: (NOTE) Effective June 14, 2014, Hepatitis Acute Panel (test code [id number]) will be revised to automatically reflex to the Hepatitis C Viral RNA, Quantitative, Real-Time PCR assay if the Hepatitis C antibody screening result is Reactive. This action is being taken to ensure that the CDC/USPSTF recommended HCV diagnostic algorithm with the appropriate test reflex needed for accurate interpretation is followed.    Hep B C IgM NON REACTIVE NON REACTIVE    Comment: (NOTE) High levels of Hepatitis B Core IgM antibody are detectable during the acute stage of Hepatitis B. This antibody is used to differentiate current from past HBV infection. Performed at [facility]     Physical Findings: AIMS: Facial and Oral Movements Muscles of Facial Expression: None, normal Lips and Perioral Area: None, normal Jaw: None, normal Tongue: None, normal,Extremity Movements Upper (arms, wrists, hands, fingers): None,  normal Lower (legs, knees, ankles, toes): None, normal, Trunk Movements Neck, shoulders, hips: None, normal, Overall Severity Severity of abnormal movements (highest score from questions above): None, normal Incapacitation due to abnormal movements: None, normal Patient's awareness of abnormal movements (rate only patient's report): No Awareness, Dental Status Current problems with teeth and/or dentures?: No Does patient usually wear dentures?: No  CIWA:  CIWA-Ar Total: 3 COWS:  COWS Total Score: 6  Psychiatric Specialty Exam: See Psychiatric Specialty Exam and Suicide Risk Assessment completed by Attending Physician prior to discharge.  Discharge destination:  Home  Is patient on multiple antipsychotic therapies at discharge:  No   Has Patient had three or more failed trials of antipsychotic monotherapy by history:  No  Recommended Plan for Multiple Antipsychotic Therapies: NA     Medication List    TAKE these medications      Indication   hydrOXYzine 25 MG tablet  Commonly known as:  ATARAX/VISTARIL  Take 1 tablet (25 mg total) by mouth every 6 (six) hours as needed for anxiety. For anxiety   Indication:  Anxiety Neurosis     traZODone 100 MG tablet  Commonly known as:  DESYREL  Take 1 tablet (100 mg total) by mouth at  bedtime and may repeat dose one time if needed.   Indication:  Anxiety Disorder, Trouble Sleeping, Major Depressive Disorder           Follow-up Information    Follow up with [facility]      Follow-up recommendations:  Activity:  As tolerated Diet:  As tolerated3  Comments:  Take all medications as prescribed. Keep all follow-up appointments as scheduled.  Do not consume alcohol or use illegal drugs while on prescription medications. Report any adverse effects from your medications to your primary care provider promptly.  In the event of recurrent symptoms or worsening symptoms, call 911, a crisis hotline, or go to the nearest emergency  department for evaluation.   Total Discharge Time:  Greater than 30 minutes.  SignedAdonis Brook MAY, AGNP-BC 06/04/2014, 9:53 PM  I personally assessed the patient and formulated the plan Madie Reno A. Dub Mikes, M.D.

## 2014-06-04 NOTE — Progress Notes (Signed)
[facility] :  Will you be returning to the same living situation after discharge: Yes,  patient will return home with his parents At discharge, do you have transportation home?:Yes,  patient reports transportation by his parents Do you have the ability to pay for your medications:Yes,  patient will be provided with any needed prescriptions at discharge.  Release of information consent forms completed and in the chart;  Patient's signature needed at discharge.  Patient to Follow up at: Follow-up Information    Follow up with [facility].      Patient denies SI/HI:   Yes,  denies    Aeronautical engineerafety Planning and Suicide Prevention discussed:  Yes,  with patient  Huberta Tompkins, [place] CarboKristin L 06/04/2014, 12:45 PM

## 2014-06-04 NOTE — Plan of Care (Signed)
Problem: Alteration in mood & ability to function due to Goal: STG-Patient will comply with prescribed medication regimen (Patient will comply with prescribed medication regimen)  Outcome: Progressing Pt is compliant and cooperative with detox protocol, utilizing prn medications as prescribed.

## 2014-06-04 NOTE — Progress Notes (Signed)
Patient ID: Dustin Case, male   DOB: 11/25/1990, 23 y.o.   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 Denies any SI or withdrawal symptoms. A: Obtained all belongings, prescriptions, and sample meds. R: Family/friend here to pick him up.

## 2014-06-04 NOTE — [facility] Suicide Risk Assessment (Signed)
Suicide Risk Assessment  Discharge Assessment     Demographic Factors:  Adolescent or young adult and Caucasian  Total Time spent with patient: 30 minutes  Psychiatric Specialty Exam:     Blood pressure 114/67, pulse 73, temperature 98 F (36.7 C), temperature source Oral, resp. rate 16, height 5\' 8"  (1.727 m), weight 63.957 kg (141 lb), SpO2 100 %.Body mass index is 21.44 kg/(m^2).  General Appearance: Fairly Groomed  Patent attorneyye Contact::  Fair  Speech:  Clear and Coherent  Volume:  Normal  Mood:  Euthymic  Affect:  Appropriate  Thought Process:  Coherent and Goal Directed  Orientation:  Full (Time, Place, and Person)  Thought Content:  plans as he moves on, relapse prevention plan  Suicidal Thoughts:  No  Homicidal Thoughts:  No  Memory:  Immediate;   Fair Recent;   Fair Remote;   Fair  Judgement:  Fair  Insight:  Present  Psychomotor Activity:  Normal  Concentration:  Fair  Recall:  FiservFair  Fund of Knowledge:NA  Language: Fair  Akathisia:  No  Handed:    AIMS (if indicated):     Assets:  Desire for Improvement Housing Social Support Vocational/Educational  Sleep:  Number of Hours: 6    Musculoskeletal: Strength & Muscle Tone: within normal limits Gait & Station: normal Patient leans: N/A   Mental Status Per Nursing Assessment::   On Admission:  NA (Pt denies)  Current Mental Status by Physician: In full contact with reality. There are no active S/S of withdrawal. There are no active SI plans or intent. He is aware of his Hep C diagnosis and is going to pursue follow up   Loss Factors: NA  Historical Factors: NA  Risk Reduction Factors:   Sense of responsibility to family, Employed, Living with another person, especially a relative and Positive social support  Continued Clinical Symptoms:  Alcohol/Substance Abuse/Dependencies  Cognitive Features That Contribute To Risk:  Closed-mindedness Polarized thinking Thought constriction (tunnel vision)     Suicide Risk:  Minimal: No identifiable suicidal ideation.  Patients presenting with no risk factors but with morbid ruminations; may be classified as minimal risk based on the severity of the depressive symptoms  Discharge Diagnoses:   AXIS I:  Polysubstance Dependence including opioids, Substance Induced Mood Disorder AXIS II:  No diagnosis AXIS III:  History reviewed. No pertinent past medical history. AXIS IV:  other psychosocial or environmental problems AXIS V:  61-70 mild symptoms  Plan Of Care/Follow-up recommendations:  Activity:  as tolerated Diet:  regular Follow up outpatient basis Is patient on multiple antipsychotic therapies at discharge:  No   Has Patient had three or more failed trials of antipsychotic monotherapy by history:  No  Recommended Plan for Multiple Antipsychotic Therapies: NA    Deloros Beretta A 06/04/2014, 1:29 PM

## 2014-06-04 NOTE — [facility] Group Notes (Signed)
   [facility] Note  06/04/2014  8:45 AM   Participation Quality: Alert, Appropriate and Oriented  Mood/Affect: Anxious  Depression Rating: 0  Anxiety Rating: 3  Thoughts of Suicide: Pt denies SI/HI  Will you contract for safety? Yes  Current AVH: Pt denies  Plan for Discharge/Comments: Pt attended discharge planning group and actively participated in group. CSW provided pt with today's workbook. Patient reports feeling "good" today. Patient to return home at discharge, declines follow up at this time.  Transportation Means: Pt reports access to transportation by his parents  Supports: Patient identifies his parents as supports.  Samuella BruinKristin Ravynn Hogate, MSW, [employer] IncLCSWA Clinical Social Worker [facility] [phone]

## 2014-06-05 NOTE — Progress Notes (Signed)
[facility] Group Notes:  (Nursing/MHT/Case Management/Adjunct)  Date:  06/05/2014  Time:  1:25 PM  Type of Therapy:  Therapeutic Activity  Participation Level:  Active  Participation Quality:  Appropriate and Attentive  Affect:  Appropriate  Engagement in Group:  Engaged   Legend Pecore C 06/05/2014, 1:25 PM 

## 2014-06-09 NOTE — Progress Notes (Signed)
Patient Discharge Instructions:  No documentation was faxed for HBIPS.  Per the SW the patient declined follow up.   Dustin ReddenSheena E Panorama Case, 06/09/2014, 1:34 PM

## 2015-05-23 IMAGING — CR DG CHEST 2V
2 series · 2 of 2 positions shown · non-contrast
Comparison: 10/29/2009

CLINICAL DATA: Acute Chest pain.  IV drug use.

EXAM:
CHEST  2 VIEW

[view not recorded (1 of 2)]
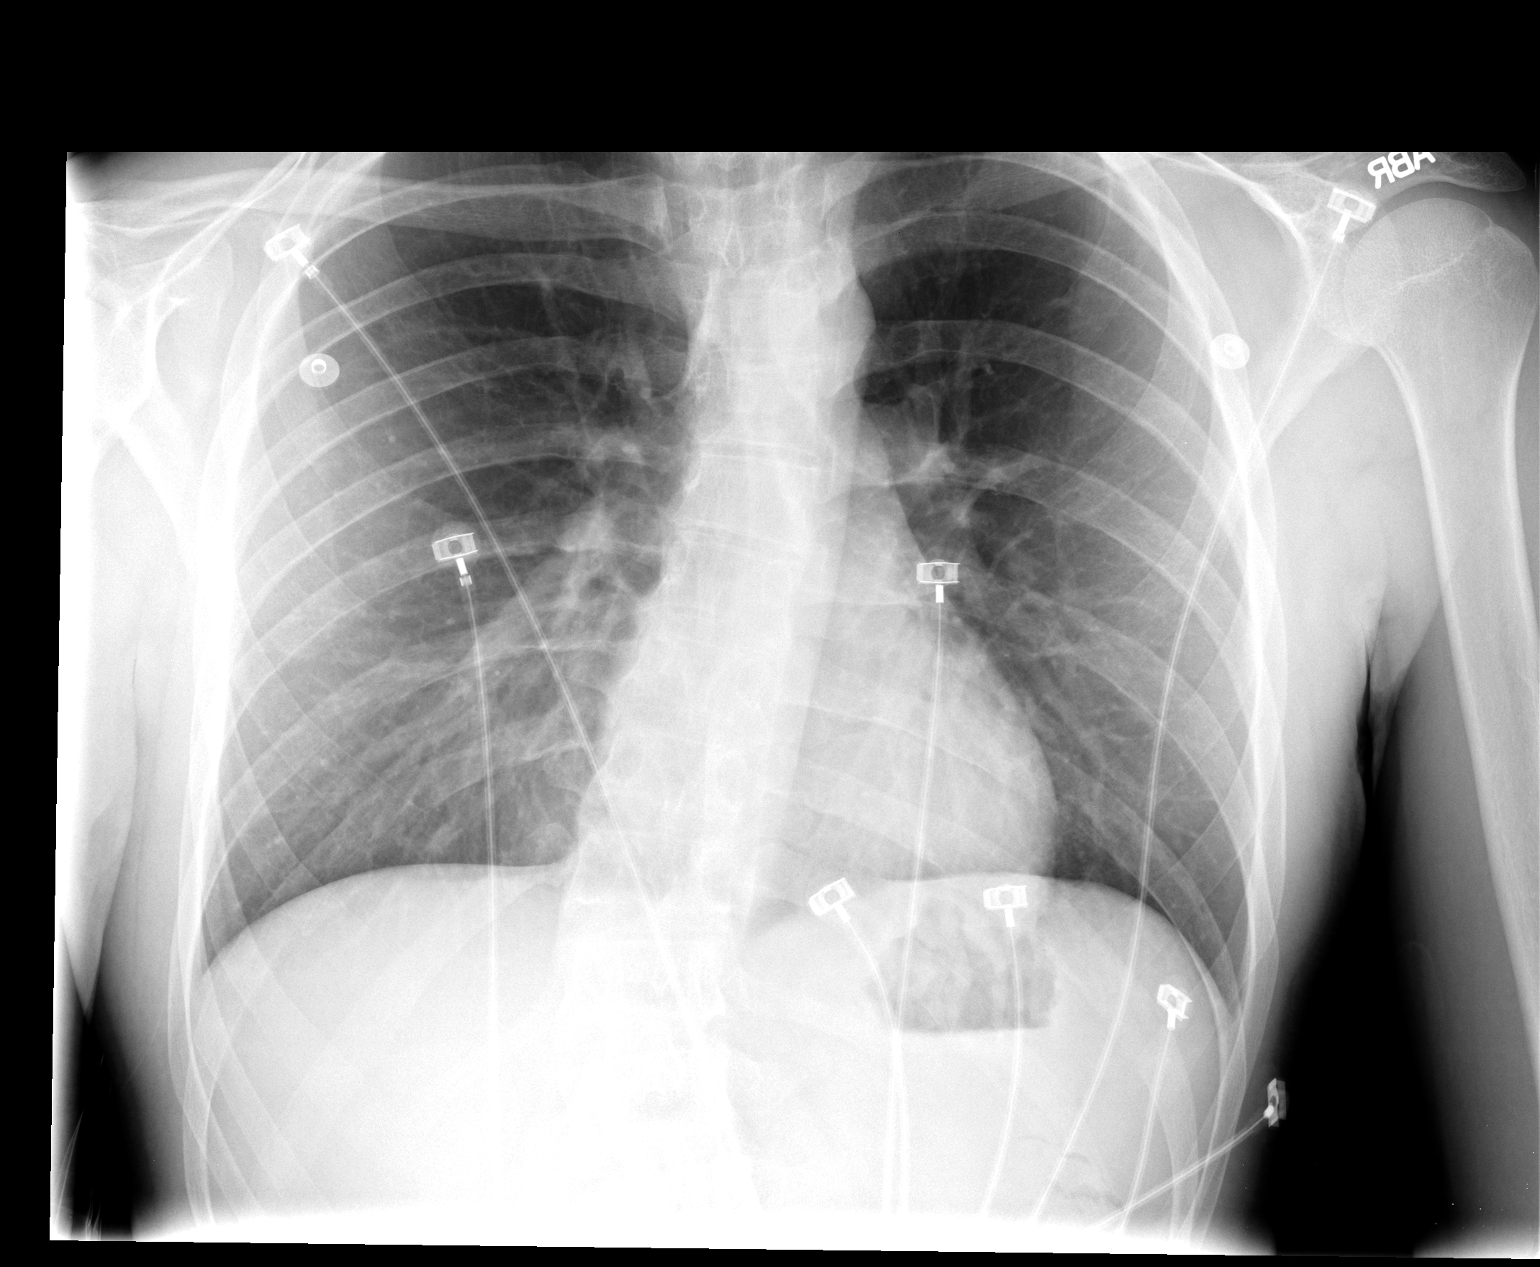

[view not recorded (2 of 2)]
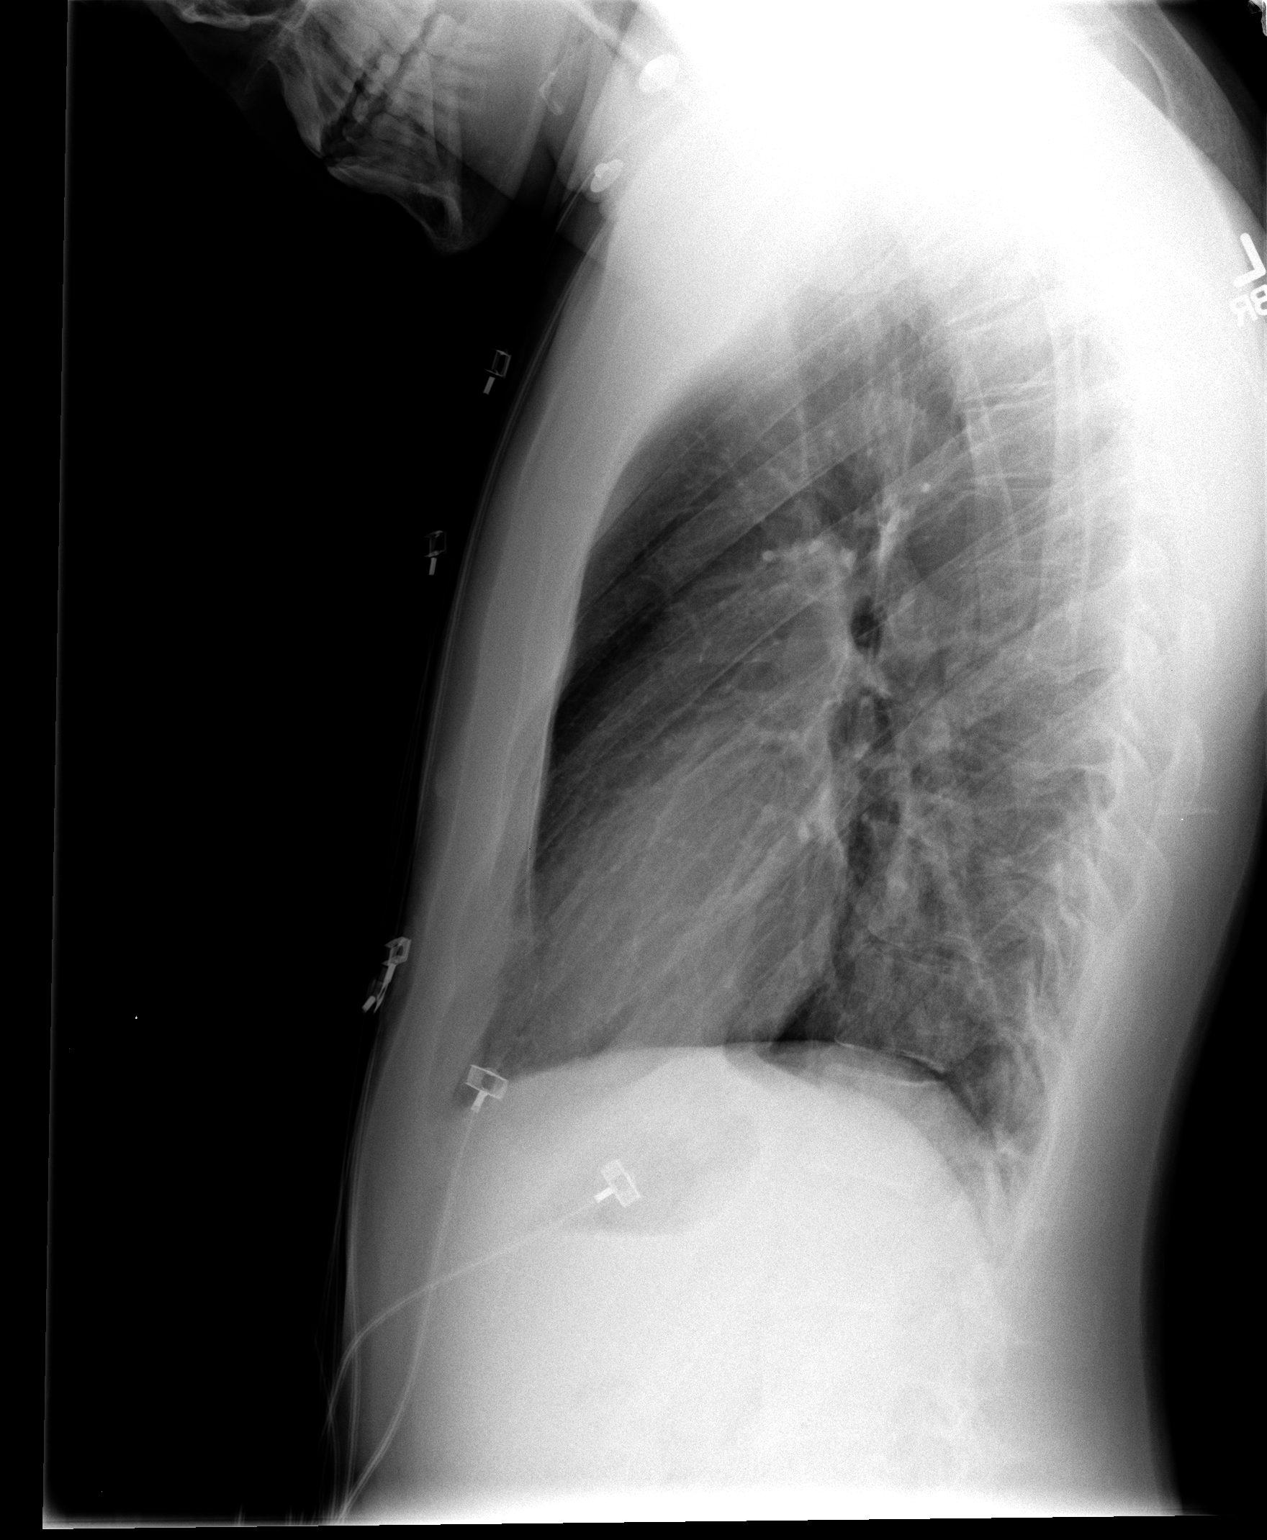

[2 of 2 positions shown; findings below may reference images not displayed]

FINDINGS: The cardiac silhouette, mediastinal and hilar contours are within
normal limits and stable. The lungs are clear. No pleural effusion
or pneumothorax. Moderate thoracolumbar scoliosis is again
demonstrated.
IMPRESSION: No acute cardiopulmonary findings.

## 2017-08-15 ENCOUNTER — Emergency Department (HOSPITAL_COMMUNITY)
Admission: EM | Admit: 2017-08-15 | Discharge: 2017-08-15 | Disposition: A | Discharge status: Home / Self Care | Payer: Self-pay | Attending: Emergency Medicine | Admitting: Emergency Medicine

## 2017-08-15 ENCOUNTER — Encounter (HOSPITAL_COMMUNITY): Payer: Self-pay

## 2017-08-15 ENCOUNTER — Emergency Department (HOSPITAL_COMMUNITY): Payer: Self-pay

## 2017-08-15 DIAGNOSIS — R0602 Shortness of breath: Secondary | ICD-10-CM

## 2017-08-15 DIAGNOSIS — J9801 Acute bronchospasm: Secondary | ICD-10-CM | POA: Insufficient documentation

## 2017-08-15 DIAGNOSIS — F17290 Nicotine dependence, other tobacco product, uncomplicated: Secondary | ICD-10-CM | POA: Insufficient documentation

## 2017-08-15 MED ORDER — DEXAMETHASONE 4 MG PO TABS
10.0000 mg | ORAL_TABLET | Freq: Once | ORAL | Status: AC
  Administered 2017-08-15: 10 mg via ORAL
  Filled 2017-08-15: qty 3

## 2017-08-15 MED ORDER — IPRATROPIUM-ALBUTEROL 0.5-2.5 (3) MG/3ML IN SOLN
3.0000 mL | Freq: Once | RESPIRATORY_TRACT | Status: AC
  Administered 2017-08-15: 3 mL via RESPIRATORY_TRACT
  Filled 2017-08-15: qty 3

## 2017-08-15 MED ORDER — ALBUTEROL SULFATE HFA 108 (90 BASE) MCG/ACT IN AERS
2.0000 | INHALATION_SPRAY | RESPIRATORY_TRACT | Status: DC | PRN
  Administered 2017-08-15: 2 via RESPIRATORY_TRACT
  Filled 2017-08-15: qty 6.7

## 2017-08-15 NOTE — ED Notes (Signed)
Patient transported to X-ray 

## 2017-08-15 NOTE — ED Provider Notes (Signed)
[facility] EMERGENCY DEPARTMENT Provider Note   CSN: [id number] Arrival date & time: 08/15/17  0211     History   Chief Complaint Chief Complaint  Patient presents with  . Shortness of Breath    HPI Dustin Case is a 27 y.o. male.  The history is provided by the patient.  He woke up with a feeling of not being able to breathe.  He has had a slight nonproductive cough but denies fever or chills or sweats.  He denies chest pain, heaviness, tightness, pressure.  He notices that if he gets in certain positions, his breathing seems to get worse.  He denies nausea or vomiting and denies arthralgias or myalgias.  He is a non-smoker.  History reviewed. No pertinent past medical history.  Patient Active Problem List   Diagnosis Date Noted  . Polysubstance (including opioids) dependence, binge pattern (HCC) 06/02/2014  . Substance induced mood disorder (HCC) 06/01/2014  . Substance abuse (HCC)     History reviewed. No pertinent surgical history.     Home Medications    Prior to Admission medications   Medication Sig Start Date End Date Taking? Authorizing Provider  hydrOXYzine (ATARAX/VISTARIL) 25 MG tablet Take 1 tablet (25 mg total) by mouth every 6 (six) hours as needed for anxiety. For anxiety 06/04/14   Adonis Brook, NP  traZODone (DESYREL) 100 MG tablet Take 1 tablet (100 mg total) by mouth at bedtime and may repeat dose one time if needed. 06/04/14   Adonis Brook, NP    Family History No family history on file.  Social History Social History   Tobacco Use  . Smoking status: Never Smoker  . Smokeless tobacco: Current User  Substance Use Topics  . Alcohol use: Yes    Comment: Says he drinks occasionally, maybe once a week approx 4 beers  . Drug use: No    Comment: Pt was + amphet, benzos, cocaine, THC     Allergies   Patient has no known allergies.   Review of Systems Review of Systems  All other systems reviewed and are negative.    Physical  Exam Updated Vital Signs BP (!) 133/96 (BP Location: Left Arm)   Pulse 81   Temp 98.2 F (36.8 C) (Oral)   Resp 17   Ht 5\' 9"  (1.753 m)   Wt 77.1 kg (170 lb)   SpO2 100%   BMI 25.10 kg/m   Physical Exam  Nursing note and vitals reviewed.  27 year old male, resting comfortably and in no acute distress. Vital signs are significant for mild hypertension. Oxygen saturation is 100%, which is normal. Head is normocephalic and atraumatic. PERRLA, EOMI. Oropharynx is clear. Neck is nontender and supple without adenopathy or JVD. Back is nontender and there is no CVA tenderness. Lungs are clear without rales, wheezes, or rhonchi.  There is a slightly prolonged exhalation phase. Chest is nontender. Heart has regular rate and rhythm without murmur. Abdomen is soft, flat, nontender without masses or hepatosplenomegaly and peristalsis is normoactive. Extremities have no cyanosis or edema, full range of motion is present. Skin is warm and dry without rash. Neurologic: Mental status is normal, cranial nerves are intact, there are no motor or sensory deficits.  ED Treatments / Results   Radiology Dg Chest 2 View  Result Date: 08/15/2017 CLINICAL DATA:  27 y/o  M; 2 hours of shortness of breath and cough. EXAM: CHEST  2 VIEW COMPARISON:  07/15/2017 chest radiograph. FINDINGS: Stable heart size and mediastinal  contours are within normal limits. Both lungs are clear. Mild dextrocurvature of thoracolumbar junction. IMPRESSION: No active cardiopulmonary disease. Electronically Signed   By: Mitzi HansenLance  Furusawa-Stratton M.D.   On: 08/15/2017 03:39    Procedures Procedures (including critical care time)  Medications Ordered in ED Medications  dexamethasone (DECADRON) tablet 10 mg (not administered)  albuterol (PROVENTIL HFA;VENTOLIN HFA) 108 (90 Base) MCG/ACT inhaler 2 puff (not administered)  ipratropium-albuterol (DUONEB) 0.5-2.5 (3) MG/3ML nebulizer solution 3 mL (3 mLs Nebulization Given 08/15/17  0340)     Initial Impression / Assessment and Plan / ED Course  I have reviewed the triage vital signs and the nursing notes.  Pertinent labs & imaging results that were available during my care of the patient were reviewed by me and considered in my medical decision making (see chart for details).  Subjective dyspnea.  Will check chest x-ray and will give therapeutic trial of albuterol with ipratropium.  Chest x-ray is unremarkable.  He had excellent symptomatic relief with albuterol and ipratropium.  He is given a dose of dexamethasone and is given an albuterol inhaler to take home.  Final Clinical Impressions(s) / ED Diagnoses   Final diagnoses:  Shortness of breath  Bronchospasm    ED Discharge Orders    None       Dione BoozeGlick, Zeola Brys, MD 08/15/17 [phone]

## 2017-08-15 NOTE — ED Notes (Signed)
RT notified of order for breathing treatment

## 2017-08-15 NOTE — ED Triage Notes (Signed)
Pt states he was starting to fall asleep when he felt like he couldn't catch his breath, states he is still feeling that way now, denies cp.  Pt is speaking in complete sentences, appears nad.

## 2018-08-07 IMAGING — DX DG CHEST 2V
2 series · 2 of 2 positions shown · non-contrast
Comparison: 07/15/2017 chest radiograph.

CLINICAL DATA: 26 y/o  M; 2 hours of shortness of breath and cough.

EXAM:
CHEST  2 VIEW

[chest pa]
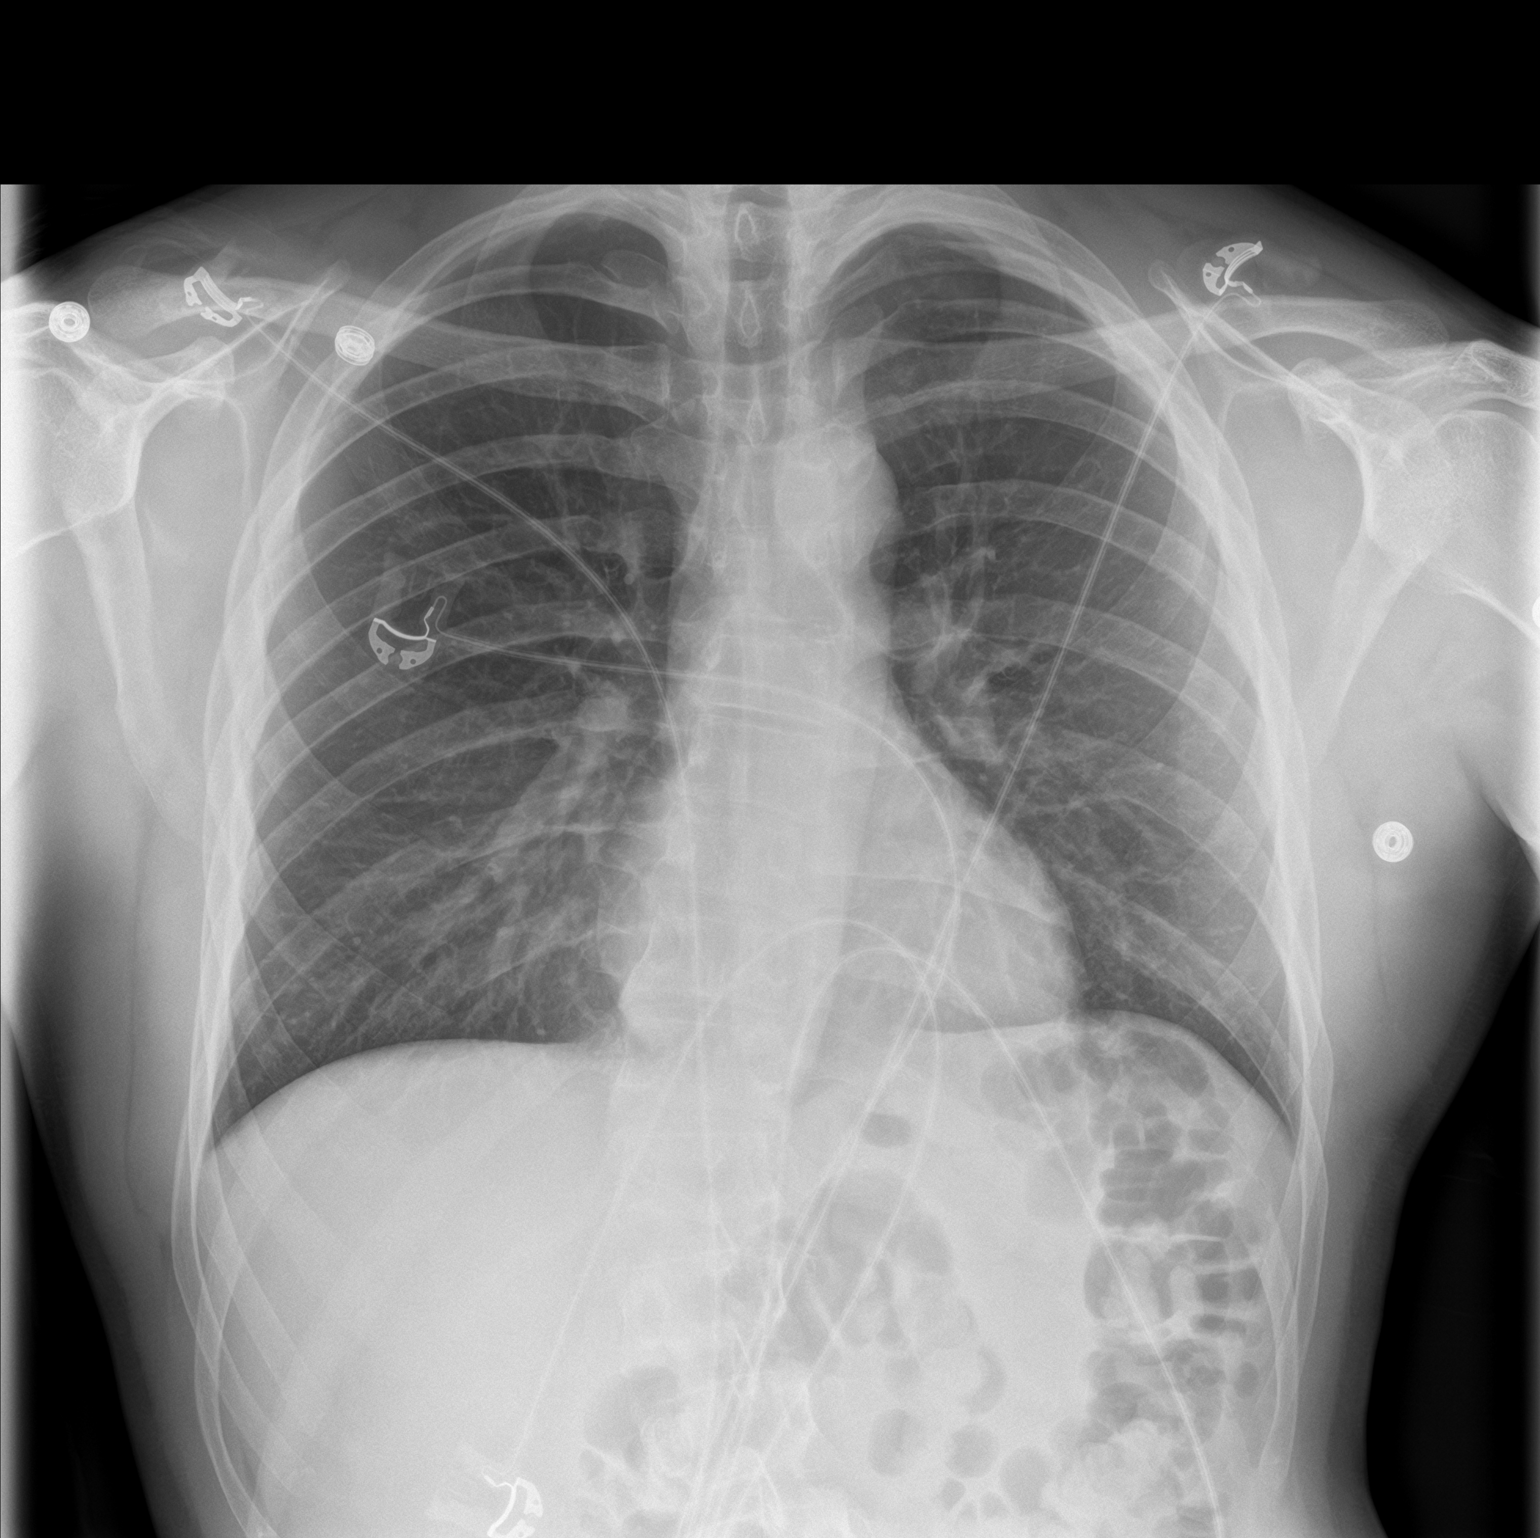

[chest lat]
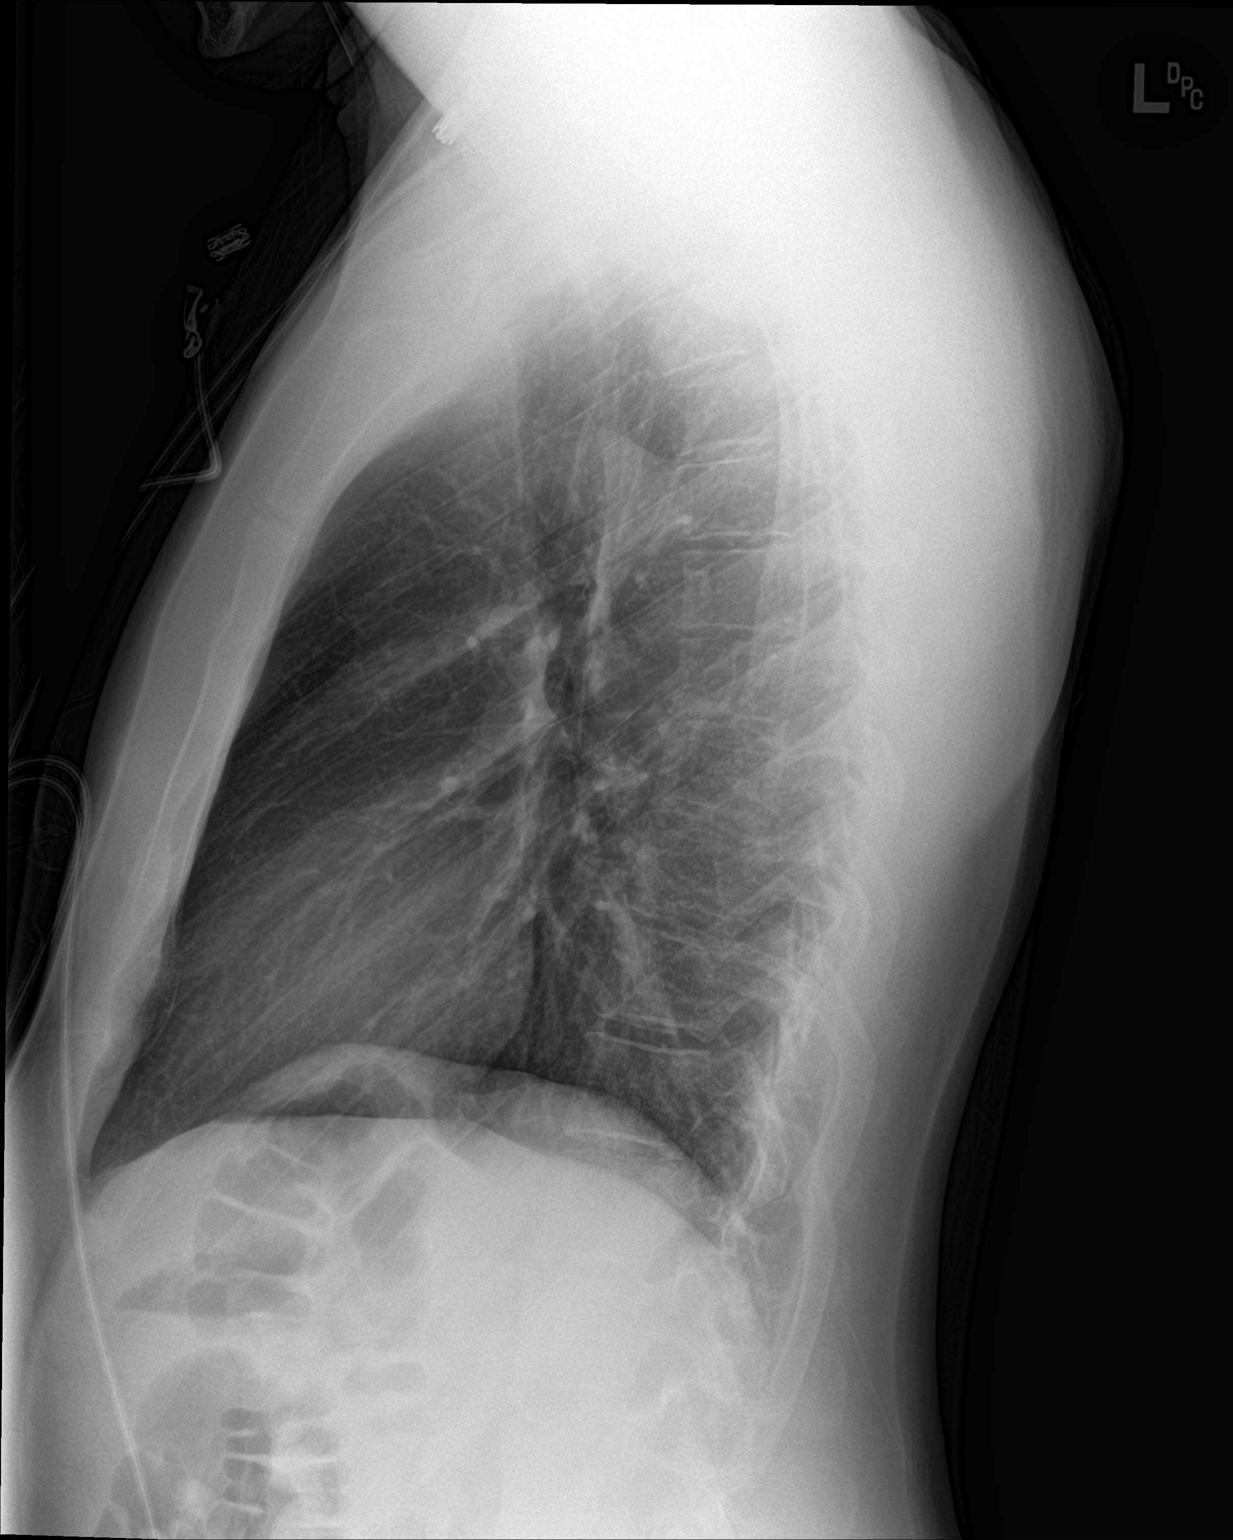

[2 of 2 positions shown; findings below may reference images not displayed]

FINDINGS: Stable heart size and mediastinal contours are within normal limits.
Both lungs are clear. Mild dextrocurvature of thoracolumbar
junction.
IMPRESSION: No active cardiopulmonary disease.

By: Hanz Vaez M.D.
